# Patient Record
Sex: Male | Born: 1955 | Race: White | Hispanic: No | Marital: Married | Smoking: Former smoker
Health system: Southern US, Community
[De-identification: ages and names within clinical notes are randomized; demographics above are authoritative.]

## PROBLEM LIST (undated history)

## (undated) DIAGNOSIS — H5440 Blindness, one eye, unspecified eye: Secondary | ICD-10-CM

## (undated) DIAGNOSIS — J9383 Other pneumothorax: Secondary | ICD-10-CM

## (undated) DIAGNOSIS — E785 Hyperlipidemia, unspecified: Secondary | ICD-10-CM

## (undated) DIAGNOSIS — J449 Chronic obstructive pulmonary disease, unspecified: Secondary | ICD-10-CM

## (undated) DIAGNOSIS — I10 Essential (primary) hypertension: Secondary | ICD-10-CM

## (undated) HISTORY — PX: EYE SURGERY: SHX253

## (undated) HISTORY — PX: PLEURAL SCARIFICATION: SHX748

## (undated) HISTORY — PX: BASAL CELL CARCINOMA EXCISION: SHX1214

---

## 2008-02-15 ENCOUNTER — Ambulatory Visit: Payer: Self-pay | Admitting: Gastroenterology

## 2008-02-29 ENCOUNTER — Ambulatory Visit: Payer: Self-pay | Admitting: Gastroenterology

## 2008-03-01 ENCOUNTER — Ambulatory Visit: Payer: Self-pay | Admitting: Gastroenterology

## 2010-02-05 DIAGNOSIS — Z85828 Personal history of other malignant neoplasm of skin: Secondary | ICD-10-CM

## 2010-02-05 HISTORY — DX: Personal history of other malignant neoplasm of skin: Z85.828

## 2011-06-13 ENCOUNTER — Ambulatory Visit: Payer: Self-pay | Admitting: Family Medicine

## 2011-09-16 ENCOUNTER — Ambulatory Visit: Payer: Self-pay | Admitting: Gastroenterology

## 2011-09-18 LAB — PATHOLOGY REPORT

## 2011-11-25 ENCOUNTER — Ambulatory Visit: Payer: Self-pay | Admitting: Gastroenterology

## 2011-11-27 ENCOUNTER — Emergency Department: Payer: Self-pay | Admitting: Emergency Medicine

## 2012-03-27 ENCOUNTER — Emergency Department: Payer: Self-pay | Admitting: Emergency Medicine

## 2012-05-18 ENCOUNTER — Ambulatory Visit: Payer: Self-pay | Admitting: Gastroenterology

## 2012-05-19 LAB — PATHOLOGY REPORT

## 2012-12-14 ENCOUNTER — Ambulatory Visit: Payer: Self-pay | Admitting: Gastroenterology

## 2013-02-28 ENCOUNTER — Other Ambulatory Visit: Payer: Self-pay | Admitting: Gastroenterology

## 2013-02-28 LAB — CLOSTRIDIUM DIFFICILE(ARMC)

## 2014-01-30 ENCOUNTER — Ambulatory Visit: Payer: Self-pay | Admitting: Gastroenterology

## 2014-06-12 LAB — SURGICAL PATHOLOGY

## 2016-11-09 ENCOUNTER — Encounter: Payer: Self-pay | Admitting: Emergency Medicine

## 2016-11-09 ENCOUNTER — Emergency Department
Admission: EM | Admit: 2016-11-09 | Discharge: 2016-11-09 | Disposition: A | Discharge status: Home / Self Care | Payer: BLUE CROSS/BLUE SHIELD | Attending: Emergency Medicine | Admitting: Emergency Medicine

## 2016-11-09 DIAGNOSIS — Y999 Unspecified external cause status: Secondary | ICD-10-CM | POA: Diagnosis not present

## 2016-11-09 DIAGNOSIS — Y9301 Activity, walking, marching and hiking: Secondary | ICD-10-CM | POA: Insufficient documentation

## 2016-11-09 DIAGNOSIS — Z87891 Personal history of nicotine dependence: Secondary | ICD-10-CM | POA: Insufficient documentation

## 2016-11-09 DIAGNOSIS — R42 Dizziness and giddiness: Secondary | ICD-10-CM | POA: Diagnosis not present

## 2016-11-09 DIAGNOSIS — Z23 Encounter for immunization: Secondary | ICD-10-CM | POA: Insufficient documentation

## 2016-11-09 DIAGNOSIS — S0121XA Laceration without foreign body of nose, initial encounter: Secondary | ICD-10-CM | POA: Diagnosis not present

## 2016-11-09 DIAGNOSIS — Y929 Unspecified place or not applicable: Secondary | ICD-10-CM | POA: Insufficient documentation

## 2016-11-09 DIAGNOSIS — S022XXB Fracture of nasal bones, initial encounter for open fracture: Secondary | ICD-10-CM

## 2016-11-09 DIAGNOSIS — S0992XA Unspecified injury of nose, initial encounter: Secondary | ICD-10-CM | POA: Diagnosis present

## 2016-11-09 DIAGNOSIS — W010XXA Fall on same level from slipping, tripping and stumbling without subsequent striking against object, initial encounter: Secondary | ICD-10-CM | POA: Insufficient documentation

## 2016-11-09 HISTORY — DX: Other pneumothorax: J93.83

## 2016-11-09 LAB — BASIC METABOLIC PANEL
ANION GAP: 11 (ref 5–15)
BUN: 19 mg/dL (ref 6–20)
CALCIUM: 9.1 mg/dL (ref 8.9–10.3)
CO2: 23 mmol/L (ref 22–32)
CREATININE: 0.79 mg/dL (ref 0.61–1.24)
Chloride: 98 mmol/L — ABNORMAL LOW (ref 101–111)
Glucose, Bld: 108 mg/dL — ABNORMAL HIGH (ref 65–99)
Potassium: 4 mmol/L (ref 3.5–5.1)
Sodium: 132 mmol/L — ABNORMAL LOW (ref 135–145)

## 2016-11-09 LAB — CBC
HCT: 40.1 % (ref 40.0–52.0)
HEMOGLOBIN: 13.6 g/dL (ref 13.0–18.0)
MCH: 31.1 pg (ref 26.0–34.0)
MCHC: 34 g/dL (ref 32.0–36.0)
MCV: 91.3 fL (ref 80.0–100.0)
PLATELETS: 271 10*3/uL (ref 150–440)
RBC: 4.39 MIL/uL — AB (ref 4.40–5.90)
RDW: 13.6 % (ref 11.5–14.5)
WBC: 8.7 10*3/uL (ref 3.8–10.6)

## 2016-11-09 LAB — URINALYSIS, COMPLETE (UACMP) WITH MICROSCOPIC
Bacteria, UA: NONE SEEN
Bilirubin Urine: NEGATIVE
GLUCOSE, UA: NEGATIVE mg/dL
Hgb urine dipstick: NEGATIVE
Ketones, ur: NEGATIVE mg/dL
Leukocytes, UA: NEGATIVE
NITRITE: NEGATIVE
PH: 5 (ref 5.0–8.0)
Protein, ur: NEGATIVE mg/dL
SPECIFIC GRAVITY, URINE: 1.017 (ref 1.005–1.030)

## 2016-11-09 LAB — TROPONIN I: Troponin I: <0.03 ng/mL (ref <0.03)

## 2016-11-09 MED ORDER — LIDOCAINE HCL (PF) 1 % IJ SOLN: 30.0000 mL | Freq: Once | INTRAMUSCULAR | Status: DC

## 2016-11-09 MED ORDER — TETANUS-DIPHTH-ACELL PERTUSSIS 5-2.5-18.5 LF-MCG/0.5 IM SUSP
0.5000 mL | Freq: Once | INTRAMUSCULAR | Status: AC
  Administered 2016-11-09: 0.5 mL via INTRAMUSCULAR
  Filled 2016-11-09: qty 0.5

## 2016-11-09 MED ORDER — CEPHALEXIN 500 MG PO CAPS
500.0000 mg | ORAL_CAPSULE | Freq: Four times a day (QID) | ORAL | 0 refills | Status: AC
Start: 2016-11-09 — End: 2016-11-16

## 2016-11-09 MED ORDER — CEPHALEXIN 500 MG PO CAPS
500.0000 mg | ORAL_CAPSULE | Freq: Once | ORAL | Status: AC
  Administered 2016-11-09: 500 mg via ORAL
  Filled 2016-11-09: qty 1

## 2016-11-09 MED ORDER — LIDOCAINE HCL (PF) 1 % IJ SOLN
5.0000 mL | Freq: Once | INTRAMUSCULAR | Status: AC
  Administered 2016-11-09: 5 mL via INTRADERMAL

## 2016-11-09 MED ORDER — BACITRACIN ZINC 500 UNIT/GM EX OINT
TOPICAL_OINTMENT | Freq: Once | CUTANEOUS | Status: AC
  Administered 2016-11-09: 1 via TOPICAL
  Filled 2016-11-09: qty 0.9

## 2016-11-09 MED ORDER — LIDOCAINE HCL (PF) 1 % IJ SOLN
INTRAMUSCULAR | Status: AC
  Filled 2016-11-09: qty 5

## 2016-11-09 NOTE — ED Provider Notes (Signed)
[facility] Emergency Department Provider Note  ____________________________________________   First MD Initiated Contact with Patient 11/09/16 1821     (approximate)  I have reviewed the triage vital signs and the nursing notes.   HISTORY  Chief Complaint Dizziness and Laceration    HPI Austin Kim is a 61 y.o. male who self presents the emergency department after a near syncopal event. He has been working out recently and today he was walking briskly and then sprinting and walking briskly and sprinting. After one of these events he began to feel lightheaded off balance nauseated and weak. He fell forward striking his face. He did not lose consciousness. He does not know when his last tetanus shot was. He has no headache. He has no double vision or blurred vision. He had no antecedent chest pain or palpitations.   Past Medical History:  Diagnosis Date  . Spontaneous pneumothorax     There are no active problems to display for this patient.   Past Surgical History:  Procedure Laterality Date  . EYE SURGERY      Prior to Admission medications   Medication Sig Start Date End Date Taking? Authorizing Provider  cephALEXin (KEFLEX) 500 MG capsule Take 1 capsule (500 mg total) by mouth 4 (four) times daily. 11/09/16 11/16/16  Darel Hong, MD    Allergies Patient has no known allergies.  History reviewed. No pertinent family history.  Social History Social History  Substance Use Topics  . Smoking status: Former Research scientist (life sciences)  . Smokeless tobacco: Never Used  . Alcohol use Yes    Review of Systems Constitutional: No fever/chills ENT: No sore throat. Cardiovascular: Denies chest pain. Respiratory: Denies shortness of breath. Gastrointestinal: No abdominal pain.  No nausea, no vomiting.  No diarrhea.  No constipation. Musculoskeletal: Negative for back pain. Neurological: Negative for  headaches   ____________________________________________   PHYSICAL EXAM:  VITAL SIGNS: ED Triage Vitals  Enc Vitals Group     BP 11/09/16 1801 (!) 167/67     Pulse Rate 11/09/16 1801 96     Resp 11/09/16 1801 16     Temp 11/09/16 1800 98.2 F (36.8 C)     Temp Source 11/09/16 1800 Oral     SpO2 11/09/16 1801 100 %     Weight 11/09/16 1800 150 lb (68 kg)     Height 11/09/16 1800 5\' 9"  (1.753 m)     Head Circumference --      Peak Flow --      Pain Score 11/09/16 1800 4     Pain Loc --      Pain Edu? --      Excl. in [place]? --     Constitutional: Alert and oriented 4 pleasant cooperative speaks in full clear sentences with diaphoresis Head: Atraumatic. Nose: No septal hematomas appreciated 2 cm laceration to the bridge of his nose with underlying bone exposed Obvious fracture with moving the bridge of his nose Mouth/Throat: No trismus Neck: No stridor.   Cardiovascular: Regular rate and rhythm no murmurs Respiratory: Normal respiratory effort.  No retractions. Neurologic:  Normal speech and language. No gross focal neurologic deficits are appreciated.  Skin:  2 cm laceration as above with abrasions to bilateral kneecaps as well as abrasion to the bridge of the nose is well    ____________________________________________  LABS (all labs ordered are listed, but only abnormal results are displayed)  Labs Reviewed  BASIC METABOLIC PANEL - Abnormal; Notable for the following:  Result Value   Sodium 132 (*)    Chloride 98 (*)    Glucose, Bld 108 (*)    All other components within normal limits  CBC - Abnormal; Notable for the following:    RBC 4.39 (*)    All other components within normal limits  URINALYSIS, COMPLETE (UACMP) WITH MICROSCOPIC - Abnormal; Notable for the following:    Color, Urine YELLOW (*)    APPearance CLEAR (*)    Squamous Epithelial / LPF 0-5 (*)    All other components within normal limits  TROPONIN I    Blood work reviewed  interpreted by me as normal __________________________________________  EKG  ED ECG REPORT I, Darel Hong, the attending physician, personally viewed and interpreted this ECG.  Date: 11/09/2016 EKG Time:  Rate: 89 Rhythm: normal sinus rhythm QRS Axis: normal Intervals: normal ST/T Wave abnormalities: normal Narrative Interpretation: no evidence of acute ischemia  ____________________________________________  RADIOLOGY  ____________________________________________   PROCEDURES  Procedure(s) performed: yes  LACERATION REPAIR Performed by: Darel Hong Authorized by: Darel Hong Consent: Verbal consent obtained. Risks and benefits: risks, benefits and alternatives were discussed Consent given by: patient Patient identity confirmed: provided demographic data Prepped and Draped in normal sterile fashion Wound explored  Laceration Location: bridge of nose  Laceration Length: 2cm  No Foreign Bodies seen or palpated  Anesthesia: local infiltration  Local anesthetic: lidocaine 1% without epinephrine  Anesthetic total: 3 ml  Irrigation method: syringe Amount of cleaning: standard  Skin closure: simple interrupted  Number of sutures: 3  Technique: 6-0 simple interrupted  Patient tolerance: Patient tolerated the procedure well with no immediate complications.   Procedures  Critical Care performed: no  Observation: no ____________________________________________   INITIAL IMPRESSION / ASSESSMENT AND PLAN / ED COURSE  Pertinent labs & imaging results that were available during my care of the patient were reviewed by me and considered in my medical decision making (see chart for details).   The patient's syncopal event is clearly vasovagal and not cardiogenic. His EKG has no arrhythmias, no blocks, normal intervals, no signs of [place] no signs of HOCM. He has no family history of sudden cardiac death. I was able to anesthetize his nasal wound and  explored it with good overhead lighting in a bloodless field and identify a nasal fracture underneath. After copious irrigation with normal saline and was able to provide adequate coverage with good cosmesis. As this is an open fracture will cover him with Keflex and refer him to otolaryngology in 5 days for evaluation and treatment of his nasal fracture. He is discharged home in improved condition.      ____________________________________________   FINAL CLINICAL IMPRESSION(S) / ED DIAGNOSES  Final diagnoses:  Open fracture of nasal bone, initial encounter  Laceration of nose, initial encounter      NEW MEDICATIONS STARTED DURING THIS VISIT:  Discharge Medication List as of 11/09/2016  6:57 PM    START taking these medications   Details  cephALEXin (KEFLEX) 500 MG capsule Take 1 capsule (500 mg total) by mouth 4 (four) times daily., Starting Sun 11/09/2016, Until Sun 11/16/2016, Print         Note:  This document was prepared using Dragon voice recognition software and may include unintentional dictation errors.      Darel Hong, MD 11/10/16 1431

## 2016-11-09 NOTE — ED Triage Notes (Addendum)
Pt was walking and then sprinted for 20 seconds when he became dizzy and almost passed out.  This has never happened before per pt.  Small wound noted to nose.  Did not pass all the way out. No medical history.  Ambulatory into ED without difficulty. VSS.  Describes as light headed.

## 2016-11-09 NOTE — Discharge Instructions (Signed)
Please take all of your antibiotics as prescribed and follow up with the ENT surgeon this coming Friday for a recheck.  Today I put 3 6-0 prolene sutures which will need to come out.  It's a good idea to keep your wound clean with soap and water, but please don't wash it until tomorrow.  Return to the ED for any concerns.  It was a pleasure to take care of you today, and thank you for coming to our emergency department.  If you have any questions or concerns before leaving please ask the nurse to grab me and I'm more than happy to go through your aftercare instructions again.  If you were prescribed any opioid pain medication today such as Norco, Vicodin, Percocet, morphine, hydrocodone, or oxycodone please make sure you do not drive when you are taking this medication as it can alter your ability to drive safely.  If you have any concerns once you are home that you are not improving or are in fact getting worse before you can make it to your follow-up appointment, please do not hesitate to call 911 and come back for further evaluation.  Darel Hong, MD  Results for orders placed or performed during the hospital encounter of 97/58/83  Basic metabolic panel  Result Value Ref Range   Sodium 132 (L) 135 - 145 mmol/L   Potassium 4.0 3.5 - 5.1 mmol/L   Chloride 98 (L) 101 - 111 mmol/L   CO2 23 22 - 32 mmol/L   Glucose, Bld 108 (H) 65 - 99 mg/dL   BUN 19 6 - 20 mg/dL   Creatinine, Ser 0.79 0.61 - 1.24 mg/dL   Calcium 9.1 8.9 - 10.3 mg/dL   GFR calc non Af Amer >60 >60 mL/min   GFR calc Af Amer >60 >60 mL/min   Anion gap 11 5 - 15  CBC  Result Value Ref Range   WBC 8.7 3.8 - 10.6 K/uL   RBC 4.39 (L) 4.40 - 5.90 MIL/uL   Hemoglobin 13.6 13.0 - 18.0 g/dL   HCT 40.1 40.0 - 52.0 %   MCV 91.3 80.0 - 100.0 fL   MCH 31.1 26.0 - 34.0 pg   MCHC 34.0 32.0 - 36.0 g/dL   RDW 13.6 11.5 - 14.5 %   Platelets 271 150 - 440 K/uL  Urinalysis, Complete w Microscopic  Result Value Ref Range   Color,  Urine YELLOW (A) YELLOW   APPearance CLEAR (A) CLEAR   Specific Gravity, Urine 1.017 1.005 - 1.030   pH 5.0 5.0 - 8.0   Glucose, UA NEGATIVE NEGATIVE mg/dL   Hgb urine dipstick NEGATIVE NEGATIVE   Bilirubin Urine NEGATIVE NEGATIVE   Ketones, ur NEGATIVE NEGATIVE mg/dL   Protein, ur NEGATIVE NEGATIVE mg/dL   Nitrite NEGATIVE NEGATIVE   Leukocytes, UA NEGATIVE NEGATIVE   RBC / HPF 0-5 0 - 5 RBC/hpf   WBC, UA 0-5 0 - 5 WBC/hpf   Bacteria, UA NONE SEEN NONE SEEN   Squamous Epithelial / LPF 0-5 (A) NONE SEEN   Mucus PRESENT    Hyaline Casts, UA PRESENT   Troponin I  Result Value Ref Range   Troponin I <0.03 <0.03 ng/mL   No results found.

## 2017-07-27 ENCOUNTER — Encounter: Payer: Self-pay | Admitting: *Deleted

## 2017-07-28 ENCOUNTER — Ambulatory Visit: Payer: BLUE CROSS/BLUE SHIELD | Admitting: Anesthesiology

## 2017-07-28 ENCOUNTER — Encounter: Payer: Self-pay | Admitting: *Deleted

## 2017-07-28 ENCOUNTER — Encounter
Admission: RE | Disposition: A | Discharge status: Home / Self Care | Payer: Self-pay | Source: Ambulatory Visit | Attending: Gastroenterology

## 2017-07-28 ENCOUNTER — Ambulatory Visit
Admission: RE | Admit: 2017-07-28 | Discharge: 2017-07-28 | Disposition: A | Discharge status: Home / Self Care | Payer: BLUE CROSS/BLUE SHIELD | Source: Ambulatory Visit | Attending: Gastroenterology | Admitting: Gastroenterology

## 2017-07-28 DIAGNOSIS — Z86010 Personal history of colonic polyps: Secondary | ICD-10-CM | POA: Insufficient documentation

## 2017-07-28 DIAGNOSIS — Z85828 Personal history of other malignant neoplasm of skin: Secondary | ICD-10-CM | POA: Diagnosis not present

## 2017-07-28 DIAGNOSIS — K621 Rectal polyp: Secondary | ICD-10-CM | POA: Insufficient documentation

## 2017-07-28 DIAGNOSIS — K635 Polyp of colon: Secondary | ICD-10-CM | POA: Insufficient documentation

## 2017-07-28 DIAGNOSIS — D128 Benign neoplasm of rectum: Secondary | ICD-10-CM | POA: Insufficient documentation

## 2017-07-28 DIAGNOSIS — Z79899 Other long term (current) drug therapy: Secondary | ICD-10-CM | POA: Diagnosis not present

## 2017-07-28 DIAGNOSIS — I10 Essential (primary) hypertension: Secondary | ICD-10-CM | POA: Insufficient documentation

## 2017-07-28 DIAGNOSIS — H5461 Unqualified visual loss, right eye, normal vision left eye: Secondary | ICD-10-CM | POA: Diagnosis not present

## 2017-07-28 DIAGNOSIS — Z1211 Encounter for screening for malignant neoplasm of colon: Secondary | ICD-10-CM | POA: Insufficient documentation

## 2017-07-28 DIAGNOSIS — J449 Chronic obstructive pulmonary disease, unspecified: Secondary | ICD-10-CM | POA: Insufficient documentation

## 2017-07-28 DIAGNOSIS — Z87891 Personal history of nicotine dependence: Secondary | ICD-10-CM | POA: Insufficient documentation

## 2017-07-28 DIAGNOSIS — Z800 Family history of malignant neoplasm of digestive organs: Secondary | ICD-10-CM | POA: Diagnosis not present

## 2017-07-28 DIAGNOSIS — K5730 Diverticulosis of large intestine without perforation or abscess without bleeding: Secondary | ICD-10-CM | POA: Insufficient documentation

## 2017-07-28 HISTORY — PX: COLONOSCOPY WITH PROPOFOL: SHX5780

## 2017-07-28 HISTORY — DX: Blindness, one eye, unspecified eye: H54.40

## 2017-07-28 HISTORY — DX: Hyperlipidemia, unspecified: E78.5

## 2017-07-28 HISTORY — DX: Chronic obstructive pulmonary disease, unspecified: J44.9

## 2017-07-28 HISTORY — DX: Essential (primary) hypertension: I10

## 2017-07-28 SURGERY — COLONOSCOPY WITH PROPOFOL
Anesthesia: General

## 2017-07-28 MED ORDER — SODIUM CHLORIDE 0.9 % IV SOLN
INTRAVENOUS | Status: DC
  Administered 2017-07-28: 1000 mL via INTRAVENOUS
  Administered 2017-07-28: 13:00:00 via INTRAVENOUS

## 2017-07-28 MED ORDER — FENTANYL CITRATE (PF) 100 MCG/2ML IJ SOLN
INTRAMUSCULAR | Status: DC | PRN
  Administered 2017-07-28: 50 ug via INTRAVENOUS

## 2017-07-28 MED ORDER — PROPOFOL 10 MG/ML IV BOLUS
INTRAVENOUS | Status: DC | PRN
  Administered 2017-07-28: 80 mg via INTRAVENOUS

## 2017-07-28 MED ORDER — EPHEDRINE SULFATE 50 MG/ML IJ SOLN
INTRAMUSCULAR | Status: DC | PRN
  Administered 2017-07-28 (×2): 10 mg via INTRAVENOUS

## 2017-07-28 MED ORDER — PROPOFOL 500 MG/50ML IV EMUL
INTRAVENOUS | Status: DC | PRN
  Administered 2017-07-28: 100 ug/kg/min via INTRAVENOUS

## 2017-07-28 MED ORDER — FENTANYL CITRATE (PF) 100 MCG/2ML IJ SOLN
INTRAMUSCULAR | Status: AC
  Filled 2017-07-28: qty 2

## 2017-07-28 MED ORDER — PROPOFOL 500 MG/50ML IV EMUL
INTRAVENOUS | Status: AC
  Filled 2017-07-28: qty 50

## 2017-07-28 MED ORDER — MIDAZOLAM HCL 2 MG/2ML IJ SOLN
INTRAMUSCULAR | Status: AC
  Filled 2017-07-28: qty 2

## 2017-07-28 NOTE — Transfer of Care (Signed)
Immediate Anesthesia Transfer of Care Note  Patient: Austin Kim  Procedure(s) Performed: COLONOSCOPY WITH PROPOFOL (N/A )  Patient Location: Endoscopy Unit  Anesthesia Type:General  Level of Consciousness: awake, alert  and oriented  Airway & Oxygen Therapy: Patient Spontanous Breathing and Patient connected to nasal cannula oxygen  Post-op Assessment: Report given to RN and Post -op Vital signs reviewed and stable  Post vital signs: Reviewed and stable  Last Vitals:  Vitals Value Taken Time  BP 132/80 07/28/2017  2:01 PM  Temp    Pulse 89 07/28/2017  2:01 PM  Resp 15 07/28/2017  2:01 PM  SpO2 100 % 07/28/2017  2:01 PM  Vitals shown include unvalidated device data.  Last Pain:  Vitals:   07/28/17 1152  TempSrc: Tympanic  PainSc: 0-No pain         Complications: No apparent anesthesia complications

## 2017-07-28 NOTE — Anesthesia Preprocedure Evaluation (Signed)
Anesthesia Evaluation  Patient identified by MRN, date of birth, ID band Patient awake    Reviewed: Allergy & Precautions, H&P , NPO status , Patient's Chart, lab work & pertinent test results, reviewed documented beta blocker date and time   Airway Mallampati: II   Neck ROM: full    Dental  (+) Teeth Intact   Pulmonary neg pulmonary ROS, COPD, former smoker,    Pulmonary exam normal        Cardiovascular Exercise Tolerance: Good hypertension, On Medications negative cardio ROS Normal cardiovascular exam Rhythm:regular Rate:Normal     Neuro/Psych negative neurological ROS  negative psych ROS   GI/Hepatic negative GI ROS, Neg liver ROS,   Endo/Other  negative endocrine ROS  Renal/GU negative Renal ROS  negative genitourinary   Musculoskeletal   Abdominal   Peds  Hematology negative hematology ROS (+)   Anesthesia Other Findings Past Medical History: No date: Blindness of right eye No date: COPD (chronic obstructive pulmonary disease) (HCC) No date: Elevated lipids No date: Hypertension No date: Spontaneous pneumothorax Past Surgical History: No date: BASAL CELL CARCINOMA EXCISION; Bilateral No date: EYE SURGERY No date: PLEURAL SCARIFICATION BMI    Body Mass Index:  23.63 kg/m     Reproductive/Obstetrics negative OB ROS                             Anesthesia Physical Anesthesia Plan  ASA: III  Anesthesia Plan: General   Post-op Pain Management:    Induction:   PONV Risk Score and Plan:   Airway Management Planned:   Additional Equipment:   Intra-op Plan:   Post-operative Plan:   Informed Consent: I have reviewed the patients History and Physical, chart, labs and discussed the procedure including the risks, benefits and alternatives for the proposed anesthesia with the patient or authorized representative who has indicated his/her understanding and acceptance.    Dental Advisory Given  Plan Discussed with: CRNA  Anesthesia Plan Comments:         Anesthesia Quick Evaluation

## 2017-07-28 NOTE — Anesthesia Post-op Follow-up Note (Signed)
Anesthesia QCDR form completed.        

## 2017-07-28 NOTE — Op Note (Signed)
[facility] Gastroenterology Patient Name: Austin Kim Procedure Date: 07/28/2017 12:40 PM MRN: [id number] Account #: [id number] Date of Birth: Jul 05, 1955 Admit Type: Outpatient Age: 62 Room: [facility] ENDO ROOM 3 Gender: Male Note Status: Finalized Procedure:            Colonoscopy Indications:          Personal history of colonic polyps Providers:            Lollie Sails, MD Referring MD:         Youlanda Roys. Lovie Macadamia, MD (Referring MD) Medicines:            Monitored Anesthesia Care Complications:        No immediate complications. Procedure:            Pre-Anesthesia Assessment:                       - ASA Grade Assessment: III - A patient with severe                        systemic disease.                       After obtaining informed consent, the colonoscope was                        passed under direct vision. Throughout the procedure,                        the patient's blood pressure, pulse, and oxygen                        saturations were monitored continuously. The Olympus                        PCF-H180AL colonoscope ( S#: [id number] ) was introduced                        through the anus and advanced to the the cecum,                        identified by appendiceal orifice and ileocecal valve.                        The colonoscopy was performed without difficulty. The                        patient tolerated the procedure well. The quality of                        the bowel preparation was good. Findings:      Two sessile polyps were found in the rectum. The polyps were 2 to 3 mm       in size. These polyps were removed with a cold snare. Resection and       retrieval were complete.      A 2 mm polyp was found in the hepatic flexure. The polyp was sessile.       The polyp was removed with a cold biopsy forceps. Resection and       retrieval were complete.      -The tatoo placed on previous procedure in  the hepatic flexure was       located  and noted to have no evidence of polyp recurrence.      A 1 mm polyp was found in the distal rectum. The polyp was sessile. The       polyp was removed with a cold biopsy forceps. Resection and retrieval       were complete.      Multiple small to medium-mouthed diverticula were found in the sigmoid       colon, descending colon and distal transverse colon.      The digital rectal exam was normal.      The exam was otherwise without abnormality. Impression:           - Two 2 to 3 mm polyps in the rectum, removed with a                        cold snare. Resected and retrieved.                       - One 2 mm polyp at the hepatic flexure, removed with a                        cold biopsy forceps. Resected and retrieved.                       - One 1 mm polyp in the rectum, removed with a cold                        biopsy forceps. Resected and retrieved.                       - Diverticulosis in the sigmoid colon, in the                        descending colon and in the distal transverse colon.                       - The examination was otherwise normal. Recommendation:       - Soft diet for 2 days, then advance as tolerated to                        advance diet as tolerated. Procedure Code(s):    --- Professional ---                       [phone], Colonoscopy, flexible; with removal of tumor(s),                        polyp(s), or other lesion(s) by snare technique                       [id number], 28, Colonoscopy, flexible; with biopsy, single                        or multiple Diagnosis Code(s):    --- Professional ---                       K62.1, Rectal polyp  D12.3, Benign neoplasm of transverse colon (hepatic                        flexure or splenic flexure)                       Z86.010, Personal history of colonic polyps                       K57.30, Diverticulosis of large intestine without                        perforation or abscess without bleeding CPT  copyright 2017 American Medical Association. All rights reserved. The codes documented in this report are preliminary and upon coder review may  be revised to meet current compliance requirements. Lollie Sails, MD 07/28/2017 2:02:34 PM This report has been signed electronically. Number of Addenda: 0 Note Initiated On: 07/28/2017 12:40 PM Scope Withdrawal Time: 0 hours 10 minutes 26 seconds  Total Procedure Duration: 0 hours 29 minutes 14 seconds       [facility]

## 2017-07-28 NOTE — H&P (Signed)
Outpatient short stay form Pre-procedure 07/28/2017 1:17 PM Austin Sails MD  Primary Physician: Dr. Juluis Pitch  Reason for visit: Colonoscopy  History of present illness: Patient is a 62 year old male presenting today as above.  He has personal history of adenomatous colon polyps and family history of colon cancer in a primary relative.  Tolerated his prep well.  He takes no aspirin or blood thinning agent.    Current Facility-Administered Medications:  .  0.9 %  sodium chloride infusion, , Intravenous, Continuous, Austin Sails, MD, Last Rate: 20 mL/hr at 07/28/17 1207  Medications Prior to Admission  Medication Sig Dispense Refill Last Dose  . Melatonin 1 MG TABS Take by mouth.   Past Week at Unknown time     No Known Allergies   Past Medical History:  Diagnosis Date  . Blindness of right eye   . COPD (chronic obstructive pulmonary disease) ([place])   . Elevated lipids   . Hypertension   . Spontaneous pneumothorax     Review of systems:      Physical Exam    Heart and lungs: Good rate and rhythm without rub or gallop, lungs are bilaterally clear.    HEENT: Normal cephalic atraumatic eyes are anicteric    Other:    Pertinant exam for procedure: Soft nontender nondistended bowel sounds positive normoactive.    Planned proceedures: Colonoscopy and indicated procedures. I have discussed the risks benefits and complications of procedures to include not limited to bleeding, infection, perforation and the risk of sedation and the patient wishes to proceed.    Austin Sails, MD Gastroenterology 07/28/2017  1:17 PM

## 2017-07-29 ENCOUNTER — Encounter: Payer: Self-pay | Admitting: Gastroenterology

## 2017-07-29 NOTE — Anesthesia Postprocedure Evaluation (Signed)
Anesthesia Post Note  Patient: AQUILLA VOILES III  Procedure(s) Performed: COLONOSCOPY WITH PROPOFOL (N/A )  Patient location during evaluation: Endoscopy Anesthesia Type: General Level of consciousness: awake and alert Pain management: pain level controlled Vital Signs Assessment: post-procedure vital signs reviewed and stable Respiratory status: spontaneous breathing, nonlabored ventilation and respiratory function stable Cardiovascular status: blood pressure returned to baseline and stable Postop Assessment: no apparent nausea or vomiting Anesthetic complications: no     Last Vitals:  Vitals:   07/28/17 1411 07/28/17 1421  BP: (!) 146/75 135/80  Pulse: 82 77  Resp: 17 13  Temp:    SpO2: 100% 100%    Last Pain:  Vitals:   07/29/17 0747  TempSrc:   PainSc: 0-No pain                 Alphonsus Sias

## 2017-07-30 LAB — SURGICAL PATHOLOGY

## 2017-12-03 ENCOUNTER — Ambulatory Visit: Payer: Self-pay | Admitting: Adult Health

## 2017-12-03 NOTE — Progress Notes (Unsigned)
Office visit  documented on paper chart per protocol initiated by Theodis Sato RN occupational  supervisor and in agreement with collaborating physician Miguel Aschoff MD.   See [facility] Staff and Wellness and or [place] workers compensation department for copy.   See paper chart at [facility].

## 2018-01-18 ENCOUNTER — Emergency Department: Payer: BLUE CROSS/BLUE SHIELD

## 2018-01-18 ENCOUNTER — Emergency Department
Admission: EM | Admit: 2018-01-18 | Discharge: 2018-01-18 | Disposition: A | Discharge status: Home / Self Care | Payer: BLUE CROSS/BLUE SHIELD | Attending: Emergency Medicine | Admitting: Emergency Medicine

## 2018-01-18 ENCOUNTER — Encounter: Payer: Self-pay | Admitting: Medical Oncology

## 2018-01-18 DIAGNOSIS — R079 Chest pain, unspecified: Secondary | ICD-10-CM | POA: Diagnosis present

## 2018-01-18 DIAGNOSIS — R0789 Other chest pain: Secondary | ICD-10-CM | POA: Insufficient documentation

## 2018-01-18 DIAGNOSIS — I10 Essential (primary) hypertension: Secondary | ICD-10-CM | POA: Insufficient documentation

## 2018-01-18 DIAGNOSIS — R634 Abnormal weight loss: Secondary | ICD-10-CM | POA: Insufficient documentation

## 2018-01-18 DIAGNOSIS — Z87891 Personal history of nicotine dependence: Secondary | ICD-10-CM | POA: Diagnosis not present

## 2018-01-18 DIAGNOSIS — R531 Weakness: Secondary | ICD-10-CM | POA: Diagnosis not present

## 2018-01-18 DIAGNOSIS — J449 Chronic obstructive pulmonary disease, unspecified: Secondary | ICD-10-CM | POA: Insufficient documentation

## 2018-01-18 LAB — CBC
HCT: 44.4 % (ref 39.0–52.0)
Hemoglobin: 15 g/dL (ref 13.0–17.0)
MCH: 31.5 pg (ref 26.0–34.0)
MCHC: 33.8 g/dL (ref 30.0–36.0)
MCV: 93.3 fL (ref 80.0–100.0)
Platelets: 298 10*3/uL (ref 150–400)
RBC: 4.76 MIL/uL (ref 4.22–5.81)
RDW: 12.5 % (ref 11.5–15.5)
WBC: 10.2 10*3/uL (ref 4.0–10.5)
nRBC: 0 % (ref 0.0–0.2)

## 2018-01-18 LAB — URINALYSIS, COMPLETE (UACMP) WITH MICROSCOPIC
Bacteria, UA: NONE SEEN
Bilirubin Urine: NEGATIVE
Glucose, UA: NEGATIVE mg/dL
Hgb urine dipstick: NEGATIVE
Ketones, ur: 20 mg/dL — AB
Leukocytes, UA: NEGATIVE
Nitrite: NEGATIVE
Protein, ur: NEGATIVE mg/dL
Specific Gravity, Urine: 1.015 (ref 1.005–1.030)
pH: 7 (ref 5.0–8.0)

## 2018-01-18 LAB — TROPONIN I

## 2018-01-18 LAB — BASIC METABOLIC PANEL
Anion gap: 11 (ref 5–15)
BUN: 15 mg/dL (ref 6–20)
CALCIUM: 11 mg/dL — AB (ref 8.9–10.3)
CO2: 27 mmol/L (ref 22–32)
Chloride: 97 mmol/L — ABNORMAL LOW (ref 98–111)
Creatinine, Ser: 0.67 mg/dL (ref 0.61–1.24)
GFR calc Af Amer: >60 mL/min (ref >60)
Glucose, Bld: 124 mg/dL — ABNORMAL HIGH (ref 70–99)
Potassium: 4.2 mmol/L (ref 3.5–5.1)
SODIUM: 135 mmol/L (ref 135–145)

## 2018-01-18 MED ORDER — SODIUM CHLORIDE 0.9 % IV SOLN
Freq: Once | INTRAVENOUS | Status: AC
  Administered 2018-01-18: 13:00:00 via INTRAVENOUS

## 2018-01-18 MED ORDER — IOPAMIDOL (ISOVUE-300) INJECTION 61%
100.0000 mL | Freq: Once | INTRAVENOUS | Status: AC | PRN
  Administered 2018-01-18: 100 mL via INTRAVENOUS

## 2018-01-18 NOTE — ED Notes (Signed)
Patient transported to CT 

## 2018-01-18 NOTE — ED Triage Notes (Signed)
Pt reports that he has been having intermittent central chest pain since Friday and feeling weak. Pt ambulatory and in NAD at this time, denies pain currently.

## 2018-01-18 NOTE — ED Notes (Signed)
Pt taken to Xray at this time.

## 2018-01-18 NOTE — ED Provider Notes (Signed)
[facility] Emergency Department Provider Note       Time seen: ----------------------------------------- 11:56 AM on 01/18/2018 -----------------------------------------   I have reviewed the triage vital signs and the nursing notes.  HISTORY   Chief Complaint Chest Pain and Weakness    HPI Austin Kim is a 62 y.o. male with a history of COPD, hypertension, spontaneous pneumothorax who presents to the ED for intermittent central chest pain since Friday and feeling weak.  He denies any current pain.  Patient had had some pain which has resolved now.  He denies fevers, chills, vomiting or diarrhea.  Past Medical History:  Diagnosis Date  . Blindness of right eye   . COPD (chronic obstructive pulmonary disease) ([place])   . Elevated lipids   . Hypertension   . Spontaneous pneumothorax     There are no active problems to display for this patient.   Past Surgical History:  Procedure Laterality Date  . BASAL CELL CARCINOMA EXCISION Bilateral   . COLONOSCOPY WITH PROPOFOL N/A 07/28/2017   Procedure: COLONOSCOPY WITH PROPOFOL;  Surgeon: Lollie Sails, MD;  Location: [facility] ENDOSCOPY;  Service: Endoscopy;  Laterality: N/A;  . EYE SURGERY    . PLEURAL SCARIFICATION      Allergies Patient has no known allergies.  Social History Social History   Tobacco Use  . Smoking status: Former Smoker    Last attempt to quit: 2001    Years since quitting: 18.9  . Smokeless tobacco: Never Used  Substance Use Topics  . Alcohol use: Yes  . Drug use: No    Review of Systems Constitutional: Negative for fever. Cardiovascular: Positive for recent chest pain Respiratory: Negative for shortness of breath. Gastrointestinal: Negative for abdominal pain, vomiting and diarrhea. Musculoskeletal: Negative for back pain. Skin: Negative for rash. Neurological: Positive for generalized weakness  All systems negative/normal/unremarkable except as stated in the  HPI  ____________________________________________   PHYSICAL EXAM:  VITAL SIGNS: ED Triage Vitals [01/18/18 1125]  Enc Vitals Group     BP (!) 167/98     Pulse Rate 90     Resp 16     Temp 98.6 F (37 C)     Temp Source Oral     SpO2 96 %     Weight 158 lb (71.7 kg)     Height 5\' 9"  (1.753 m)     Head Circumference      Peak Flow      Pain Score 0     Pain Loc      Pain Edu?      Excl. in [place]?    Constitutional: Alert and oriented. Well appearing and in no distress. Eyes: Conjunctivae are normal. Normal extraocular movements. ENT   Head: Normocephalic and atraumatic.   Nose: No congestion/rhinnorhea.   Mouth/Throat: Mucous membranes are moist.   Neck: No stridor. Cardiovascular: Normal rate, regular rhythm. No murmurs, rubs, or gallops. Respiratory: Normal respiratory effort without tachypnea nor retractions. Breath sounds are clear and equal bilaterally. No wheezes/rales/rhonchi. Gastrointestinal: Soft and nontender. Normal bowel sounds Musculoskeletal: Nontender with normal range of motion in extremities. No lower extremity tenderness nor edema. Neurologic:  Normal speech and language. No gross focal neurologic deficits are appreciated.  Skin:  Skin is warm, dry and intact. No rash noted. Psychiatric: Mood and affect are normal. Speech and behavior are normal.  ____________________________________________  EKG: Interpreted by me.  Sinus rhythm rate 81 bpm, normal PR interval, normal QRS, nonspecific ST changes  ____________________________________________  ED COURSE:  As part of my medical decision making, I reviewed the following data within the [place] History obtained from family if available, nursing notes, old chart and ekg, as well as notes from prior ED visits. Patient presented for intermittent chest pain, we will assess with labs and imaging as indicated at this time.    Procedures ____________________________________________   LABS (pertinent positives/negatives)  Labs Reviewed  BASIC METABOLIC PANEL - Abnormal; Notable for the following components:      Result Value   Chloride 97 (*)    Glucose, Bld 124 (*)    Calcium 11.0 (*)    All other components within normal limits  URINALYSIS, COMPLETE (UACMP) WITH MICROSCOPIC - Abnormal; Notable for the following components:   Color, Urine AMBER (*)    APPearance HAZY (*)    Ketones, ur 20 (*)    All other components within normal limits  CBC  TROPONIN I    RADIOLOGY Images were viewed by me  Chest x-ray IMPRESSION: 1. No active cardiopulmonary disease. No evidence of pneumonia or pulmonary edema. 2. Chronic interstitial lung disease/fibrosis. IMPRESSION: Chest:  1. Left lower lobe subpleural nodularity, with a dominant nodule measuring 2.2 cm. This is associated with linear opacity extending from the posterior pleura to the diaphragmatic pleura and may reflect atelectasis/scar. There are additional small perifissural nodules along the left major fissure measuring up to 7 mm. Follow-up chest CT in 3 months is recommended to evaluate for interval change. 2. Chronic interstitial lung disease and emphysema (ICD10-J43.9). 3.  Aortic atherosclerosis (ICD10-I70.0).  Abdomen and pelvis:  1.  No acute intra-abdominal process. 2. Mild circumferential bladder wall thickening, nonspecific. Correlate with urinalysis to exclude cystitis.  ____________________________________________  DIFFERENTIAL DIAGNOSIS   Musculoskeletal pain, GERD, anxiety, unstable angina, PE, pneumothorax  FINAL ASSESSMENT AND PLAN  Chest pain, weakness   Plan: The patient had presented for chest pain and weakness. Patient's labs were negative with the exception of hypercalcemia.  Patient's imaging did not reveal any acute process.  Patient reportedly had a chest procedure done on the left side and there is scarring  present from where that was done previously.  Essentially CT imaging was done to rule out occult cancer which we did not find.  Patient told me that recently he had his calcium medication changed which initially I was not aware that he was taking calcium.  I will have him decrease this dose to half of what he was taking and follow-up with his doctor.   Laurence Aly, MD   Note: This note was generated in part or whole with voice recognition software. Voice recognition is usually quite accurate but there are transcription errors that can and very often do occur. I apologize for any typographical errors that were not detected and corrected.     Earleen Newport, MD 01/18/18 [phone]

## 2018-12-08 ENCOUNTER — Ambulatory Visit: Payer: Self-pay

## 2018-12-08 ENCOUNTER — Other Ambulatory Visit: Payer: Self-pay

## 2018-12-08 DIAGNOSIS — Z23 Encounter for immunization: Secondary | ICD-10-CM

## 2019-04-12 DIAGNOSIS — C4492 Squamous cell carcinoma of skin, unspecified: Secondary | ICD-10-CM

## 2019-04-12 DIAGNOSIS — Z86018 Personal history of other benign neoplasm: Secondary | ICD-10-CM

## 2019-04-12 HISTORY — DX: Personal history of other benign neoplasm: Z86.018

## 2019-04-12 HISTORY — DX: Squamous cell carcinoma of skin, unspecified: C44.92

## 2019-05-09 ENCOUNTER — Ambulatory Visit (INDEPENDENT_AMBULATORY_CARE_PROVIDER_SITE_OTHER): Payer: BC Managed Care – PPO | Admitting: Dermatology

## 2019-05-09 ENCOUNTER — Other Ambulatory Visit: Payer: Self-pay

## 2019-05-09 DIAGNOSIS — D239 Other benign neoplasm of skin, unspecified: Secondary | ICD-10-CM

## 2019-05-09 DIAGNOSIS — D235 Other benign neoplasm of skin of trunk: Secondary | ICD-10-CM | POA: Diagnosis not present

## 2019-05-09 NOTE — Progress Notes (Signed)
   Follow-Up Visit   Subjective  Austin Kim is a 64 y.o. male who presents for the following: Procedure (Mod/Severe dysplastic nevus - Right spinal mid back).   The following portions of the chart were reviewed this encounter and updated as appropriate:     Review of Systems: No other skin or systemic complaints.  Objective  Well appearing patient in no apparent distress; mood and affect are within normal limits.  A focused examination was performed including back. Relevant physical exam findings are noted in the Assessment and Plan.  Objective  Right Spinal Mid Back: Healing pink biopsy site.   Assessment & Plan  Dysplastic nevus Right Spinal Mid Back  Skin excision - Right Spinal Mid Back  Lesion length (cm):  1 Lesion width (cm):  1 Margin per side (cm):  0.2 Total excision diameter (cm):  1.4 Informed consent: discussed and consent obtained   Timeout: patient name, date of birth, surgical site, and procedure verified   Procedure prep:  Patient was prepped and draped in usual sterile fashion Prep type:  Povidone-iodine Anesthesia: the lesion was anesthetized in a standard fashion   Anesthetic:  1% lidocaine w/ epinephrine 1-100,000 buffered w/ 8.4% NaHCO3 (13cc) Instrument used: #15 blade   Hemostasis achieved with: pressure and electrodesiccation   Outcome: patient tolerated procedure well with no complications   Post-procedure details: sterile dressing applied and wound care instructions given   Dressing type: petrolatum and pressure dressing    Skin repair - Right Spinal Mid Back Complexity:  Intermediate Final length (cm):  3.3 Reason for type of repair: reduce tension to allow closure   Undermining: edges could be approximated without difficulty and edges undermined   Subcutaneous layers (deep stitches):  Suture size:  3-0 Suture type: Vicryl (polyglactin 910)   Stitches:  Buried vertical mattress Fine/surface layer approximation (top stitches):    Suture size:  3-0 Suture type comment:  Nylon Stitches: simple interrupted   Suture removal (days):  7 Hemostasis achieved with: suture Outcome: patient tolerated procedure well with no complications   Post-procedure details: sterile dressing applied and wound care instructions given   Dressing type: pressure dressing (mupirocin)    Specimen 1 - Surgical pathology Differential Diagnosis: Mod/Severe Dysplastic Nevus, biopsy proven Check Margins: Yes Healing pink biopsy site 1.0cm  [id number] Tag superior medial 12:00  Return in about 1 week (around 05/16/2019) for suture removal and EDC SCCIS scalp.

## 2019-05-09 NOTE — Patient Instructions (Addendum)
Wound Care Instructions  On the day following your surgery, you should begin doing daily dressing changes: 1. Remove the old dressing and discard it. 2. Cleanse the wound gently with tap water. This may be done in the shower or by placing a wet gauze pad directly on the wound and letting it soak for several minutes. 3. It is important to gently remove any dried blood from the wound in order to encourage healing. This may be done by gently rolling a moistened Q-tip on the dried blood. Do not pick at the wound. 4. If the wound should start to bleed, continue cleaning the wound, then place a moist gauze pad on the wound and hold pressure for a few minutes.  5. Make sure you then dry the skin surrounding the wound completely or the tape will not stick to the skin. Do not use cotton balls on the wound. 6. After the wound is clean and dry, apply the ointment gently with a Q-tip. 7. Cut a non-stick pad to fit the size of the wound. Lay the pad flush to the wound. If the wound is draining, you may want to reinforce it with a small amount of gauze on top of the non-stick pad for a little added compression to the area. 8. Use the tape to seal the area completely. 9. Select from the following with respect to your individual situation: 1. If your wound has been stitched closed: continue the above steps 1-8 at least daily until your sutures are removed. 2. If your wound has been left open to heal: continue steps 1-8 at least daily for the first 3-4 weeks. 10. We would like for you to take a few extra precautions for at least the next week. 1. Sleep with your head elevated on pillows if our wound is on your head. 2. Do not bend over or lift heavy items to reduce the chance of elevated blood pressure to the wound 3. Do not participate in particularly strenuous activities.   Below is a list of dressing supplies you might need.  . Cotton-tipped applicators - Q-tips . Gauze pads (2x2 and/or 4x4) - All-Purpose  Sponges . Non-stick dressing material - Telfa . Tape - Paper or Hypafix . New and clean tube of petroleum jelly - Vaseline    Comments on Post-Operative Period 1. Slight swelling and redness often appear around the wound. This is normal and will disappear within several days following the surgery. 2. The healing wound will drain a brownish-red-yellow discharge during healing. This is a normal phase of wound healing. As the wound begins to heal, the drainage may increase in amount. Again, this drainage is normal. 3. Notify us if the drainage becomes persistently bloody, excessively swollen, or intensely painful or develops a foul odor or red streaks.  4. If you should experience mild discomfort during the healing phase, you may take an aspirin-free medication such as Tylenol (acetaminophen). Notify us if the discomfort is severe or persistent. Avoid alcoholic beverages when taking pain medicine.  In Case of Wound Hemorrhage A wound hemorrhage is when the bandage suddenly becomes soaked with bright red blood and flows profusely. If this happens, sit down or lie down with your head elevated. If the wound has a dressing on it, do not remove the dressing. Apply pressure to the existing gauze. If the wound is not covered, use a gauze pad to apply pressure and continue applying the pressure for 20 minutes without peeking. DO NOT COVER THE WOUND WITH   A LARGE TOWEL OR Young CLOTH. Release your hand from the wound site but do not remove the dressing. If the bleeding has stopped, gently clean around the wound. Leave the dressing in place for 24 hours if possible. This wait time allows the blood vessels to close off so that you do not spark a new round of bleeding by disrupting the newly clotted blood vessels with an immediate dressing change. If the bleeding does not subside, continue to hold pressure. If matters are out of your control, contact an After Hours clinic or go to the Emergency Room.   l

## 2019-05-10 ENCOUNTER — Telehealth: Payer: Self-pay

## 2019-05-10 NOTE — Telephone Encounter (Signed)
Talked to patient and he is doing fine from surgery.

## 2019-05-16 ENCOUNTER — Other Ambulatory Visit: Payer: Self-pay

## 2019-05-16 ENCOUNTER — Ambulatory Visit: Payer: BC Managed Care – PPO | Admitting: Dermatology

## 2019-05-16 DIAGNOSIS — D044 Carcinoma in situ of skin of scalp and neck: Secondary | ICD-10-CM | POA: Diagnosis not present

## 2019-05-16 DIAGNOSIS — D239 Other benign neoplasm of skin, unspecified: Secondary | ICD-10-CM

## 2019-05-16 DIAGNOSIS — D099 Carcinoma in situ, unspecified: Secondary | ICD-10-CM

## 2019-05-16 DIAGNOSIS — D225 Melanocytic nevi of trunk: Secondary | ICD-10-CM

## 2019-05-16 NOTE — Patient Instructions (Signed)

## 2019-05-16 NOTE — Progress Notes (Signed)
   Follow-Up Visit   Subjective  Austin Kim is a 64 y.o. male who presents for the following: Skin Cancer (SCCIS on scalp to be treated today with EDC).  Also suture removal for Excision Dysplastic nevus   The following portions of the chart were reviewed this encounter and updated as appropriate:     Review of Systems: No other skin or systemic complaints.  Objective  Well appearing patient in no apparent distress; mood and affect are within normal limits.  A focused examination was performed including scalp, back. Relevant physical exam findings are noted in the Assessment and Plan.  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  Destruction of lesion  Destruction method: electrodesiccation and curettage   Informed consent: discussed and consent obtained   Timeout:  patient name, date of birth, surgical site, and procedure verified Anesthesia: the lesion was anesthetized in a standard fashion   Anesthetic:  1% lidocaine w/ epinephrine 1-100,000 local infiltration Curettage performed in three different directions: Yes   Electrodesiccation performed over the curetted area: Yes   Lesion length (cm):  0.7 Lesion width (cm):  0.7 Margin per side (cm):  0.2 Final wound size (cm):  1.1 Hemostasis achieved with:  pressure, aluminum chloride and electrodesiccation Outcome: patient tolerated procedure well with no complications   Post-procedure details: wound care instructions given    Return in about 6 months (around 11/16/2019) for UBSE.   I, Othelia Pulling, RMA, am acting as scribe for Brendolyn Patty, MD .

## 2019-11-06 ENCOUNTER — Other Ambulatory Visit: Payer: Self-pay

## 2019-11-06 ENCOUNTER — Emergency Department: Payer: BC Managed Care – PPO

## 2019-11-06 ENCOUNTER — Inpatient Hospital Stay
Admission: EM | Admit: 2019-11-06 | Discharge: 2019-11-13 | DRG: 196 | Disposition: A | Discharge status: Home Health | Payer: BC Managed Care – PPO | Attending: Internal Medicine | Admitting: Internal Medicine

## 2019-11-06 DIAGNOSIS — R0602 Shortness of breath: Secondary | ICD-10-CM | POA: Diagnosis not present

## 2019-11-06 DIAGNOSIS — E869 Volume depletion, unspecified: Secondary | ICD-10-CM | POA: Diagnosis present

## 2019-11-06 DIAGNOSIS — E871 Hypo-osmolality and hyponatremia: Secondary | ICD-10-CM | POA: Diagnosis present

## 2019-11-06 DIAGNOSIS — Z833 Family history of diabetes mellitus: Secondary | ICD-10-CM

## 2019-11-06 DIAGNOSIS — J8410 Pulmonary fibrosis, unspecified: Secondary | ICD-10-CM | POA: Diagnosis not present

## 2019-11-06 DIAGNOSIS — Z7952 Long term (current) use of systemic steroids: Secondary | ICD-10-CM

## 2019-11-06 DIAGNOSIS — J849 Interstitial pulmonary disease, unspecified: Secondary | ICD-10-CM | POA: Diagnosis present

## 2019-11-06 DIAGNOSIS — R0902 Hypoxemia: Secondary | ICD-10-CM

## 2019-11-06 DIAGNOSIS — I10 Essential (primary) hypertension: Secondary | ICD-10-CM | POA: Diagnosis present

## 2019-11-06 DIAGNOSIS — Z20822 Contact with and (suspected) exposure to covid-19: Secondary | ICD-10-CM | POA: Diagnosis present

## 2019-11-06 DIAGNOSIS — J439 Emphysema, unspecified: Secondary | ICD-10-CM | POA: Diagnosis present

## 2019-11-06 DIAGNOSIS — R531 Weakness: Secondary | ICD-10-CM

## 2019-11-06 DIAGNOSIS — Z6823 Body mass index (BMI) 23.0-23.9, adult: Secondary | ICD-10-CM

## 2019-11-06 DIAGNOSIS — H5461 Unqualified visual loss, right eye, normal vision left eye: Secondary | ICD-10-CM | POA: Diagnosis present

## 2019-11-06 DIAGNOSIS — Z825 Family history of asthma and other chronic lower respiratory diseases: Secondary | ICD-10-CM

## 2019-11-06 DIAGNOSIS — J189 Pneumonia, unspecified organism: Secondary | ICD-10-CM | POA: Diagnosis present

## 2019-11-06 DIAGNOSIS — J9601 Acute respiratory failure with hypoxia: Secondary | ICD-10-CM | POA: Diagnosis present

## 2019-11-06 DIAGNOSIS — Z8249 Family history of ischemic heart disease and other diseases of the circulatory system: Secondary | ICD-10-CM

## 2019-11-06 DIAGNOSIS — Z79890 Hormone replacement therapy (postmenopausal): Secondary | ICD-10-CM

## 2019-11-06 DIAGNOSIS — Z87891 Personal history of nicotine dependence: Secondary | ICD-10-CM

## 2019-11-06 DIAGNOSIS — J811 Chronic pulmonary edema: Secondary | ICD-10-CM | POA: Diagnosis present

## 2019-11-06 DIAGNOSIS — J441 Chronic obstructive pulmonary disease with (acute) exacerbation: Secondary | ICD-10-CM | POA: Diagnosis present

## 2019-11-06 DIAGNOSIS — E785 Hyperlipidemia, unspecified: Secondary | ICD-10-CM | POA: Diagnosis present

## 2019-11-06 DIAGNOSIS — R5383 Other fatigue: Secondary | ICD-10-CM

## 2019-11-06 DIAGNOSIS — E222 Syndrome of inappropriate secretion of antidiuretic hormone: Secondary | ICD-10-CM | POA: Diagnosis present

## 2019-11-06 DIAGNOSIS — Z800 Family history of malignant neoplasm of digestive organs: Secondary | ICD-10-CM

## 2019-11-06 DIAGNOSIS — Z79899 Other long term (current) drug therapy: Secondary | ICD-10-CM

## 2019-11-06 DIAGNOSIS — R9389 Abnormal findings on diagnostic imaging of other specified body structures: Secondary | ICD-10-CM

## 2019-11-06 DIAGNOSIS — Z85828 Personal history of other malignant neoplasm of skin: Secondary | ICD-10-CM

## 2019-11-06 DIAGNOSIS — R000 Tachycardia, unspecified: Secondary | ICD-10-CM

## 2019-11-06 DIAGNOSIS — J9621 Acute and chronic respiratory failure with hypoxia: Secondary | ICD-10-CM | POA: Diagnosis present

## 2019-11-06 DIAGNOSIS — E440 Moderate protein-calorie malnutrition: Secondary | ICD-10-CM | POA: Insufficient documentation

## 2019-11-06 LAB — CBC
HCT: 38.9 % — ABNORMAL LOW (ref 39.0–52.0)
Hemoglobin: 13.3 g/dL (ref 13.0–17.0)
MCH: 31.1 pg (ref 26.0–34.0)
MCHC: 34.2 g/dL (ref 30.0–36.0)
MCV: 91.1 fL (ref 80.0–100.0)
Platelets: 340 10*3/uL (ref 150–400)
RBC: 4.27 MIL/uL (ref 4.22–5.81)
RDW: 12 % (ref 11.5–15.5)
WBC: 13.8 10*3/uL — ABNORMAL HIGH (ref 4.0–10.5)
nRBC: 0 % (ref 0.0–0.2)

## 2019-11-06 LAB — COMPREHENSIVE METABOLIC PANEL
ALT: 16 U/L (ref 0–44)
AST: 26 U/L (ref 15–41)
Albumin: 3.4 g/dL — ABNORMAL LOW (ref 3.5–5.0)
Alkaline Phosphatase: 75 U/L (ref 38–126)
Anion gap: 10 (ref 5–15)
BUN: 8 mg/dL (ref 8–23)
CO2: 24 mmol/L (ref 22–32)
Calcium: 8.5 mg/dL — ABNORMAL LOW (ref 8.9–10.3)
Chloride: 94 mmol/L — ABNORMAL LOW (ref 98–111)
Creatinine, Ser: 0.77 mg/dL (ref 0.61–1.24)
GFR calc Af Amer: >60 mL/min (ref >60)
GFR calc non Af Amer: >60 mL/min (ref >60)
Glucose, Bld: 136 mg/dL — ABNORMAL HIGH (ref 70–99)
Potassium: 4.1 mmol/L (ref 3.5–5.1)
Sodium: 128 mmol/L — ABNORMAL LOW (ref 135–145)
Total Bilirubin: 1.1 mg/dL (ref 0.3–1.2)
Total Protein: 7.2 g/dL (ref 6.5–8.1)

## 2019-11-06 LAB — PROCALCITONIN: Procalcitonin: 0.12 ng/mL

## 2019-11-06 LAB — OSMOLALITY, URINE: Osmolality, Ur: 543 mOsm/kg (ref 300–900)

## 2019-11-06 LAB — URINALYSIS, ROUTINE W REFLEX MICROSCOPIC
Bilirubin Urine: NEGATIVE
Glucose, UA: NEGATIVE mg/dL
Hgb urine dipstick: NEGATIVE
Ketones, ur: 20 mg/dL — AB
Leukocytes,Ua: NEGATIVE
Nitrite: NEGATIVE
Protein, ur: NEGATIVE mg/dL
Specific Gravity, Urine: 1.039 — ABNORMAL HIGH (ref 1.005–1.030)
pH: 6 (ref 5.0–8.0)

## 2019-11-06 LAB — SODIUM, URINE, RANDOM: Sodium, Ur: 105 mmol/L

## 2019-11-06 LAB — RESP PANEL BY RT PCR (RSV, FLU A&B, COVID)
Influenza A by PCR: NEGATIVE
Influenza B by PCR: NEGATIVE
Respiratory Syncytial Virus by PCR: NEGATIVE
SARS Coronavirus 2 by RT PCR: NEGATIVE

## 2019-11-06 LAB — BRAIN NATRIURETIC PEPTIDE: B Natriuretic Peptide: 121.9 pg/mL — ABNORMAL HIGH (ref 0.0–100.0)

## 2019-11-06 LAB — OSMOLALITY: Osmolality: 269 mOsm/kg — ABNORMAL LOW (ref 275–295)

## 2019-11-06 LAB — TROPONIN I (HIGH SENSITIVITY): Troponin I (High Sensitivity): 16 ng/L (ref <18)

## 2019-11-06 MED ORDER — METOPROLOL TARTRATE 50 MG PO TABS
50.0000 mg | ORAL_TABLET | Freq: Two times a day (BID) | ORAL | Status: DC
  Administered 2019-11-06 – 2019-11-13 (×14): 50 mg via ORAL
  Filled 2019-11-06 (×14): qty 1

## 2019-11-06 MED ORDER — INFLUENZA VAC SPLIT QUAD 0.5 ML IM SUSY: 0.5000 mL | PREFILLED_SYRINGE | INTRAMUSCULAR | Status: DC | PRN

## 2019-11-06 MED ORDER — ALBUTEROL SULFATE (2.5 MG/3ML) 0.083% IN NEBU: 2.5000 mg | INHALATION_SOLUTION | Freq: Four times a day (QID) | RESPIRATORY_TRACT | Status: DC | PRN

## 2019-11-06 MED ORDER — IOHEXOL 350 MG/ML SOLN
75.0000 mL | Freq: Once | INTRAVENOUS | Status: AC | PRN
  Administered 2019-11-06: 75 mL via INTRAVENOUS

## 2019-11-06 MED ORDER — SODIUM CHLORIDE 0.9 % IV SOLN
500.0000 mg | INTRAVENOUS | Status: DC
  Administered 2019-11-06 – 2019-11-08 (×3): 500 mg via INTRAVENOUS
  Filled 2019-11-06 (×5): qty 500

## 2019-11-06 MED ORDER — ENOXAPARIN SODIUM 40 MG/0.4ML ~~LOC~~ SOLN
40.0000 mg | SUBCUTANEOUS | Status: DC
  Administered 2019-11-06 – 2019-11-12 (×7): 40 mg via SUBCUTANEOUS
  Filled 2019-11-06 (×7): qty 0.4

## 2019-11-06 MED ORDER — SODIUM CHLORIDE 0.9 % IV SOLN
2.0000 g | INTRAVENOUS | Status: DC
  Administered 2019-11-06 – 2019-11-08 (×3): 2 g via INTRAVENOUS
  Filled 2019-11-06: qty 2
  Filled 2019-11-06: qty 20
  Filled 2019-11-06: qty 2
  Filled 2019-11-06 (×2): qty 20

## 2019-11-06 MED ORDER — AMLODIPINE BESYLATE 5 MG PO TABS
5.0000 mg | ORAL_TABLET | ORAL | Status: DC
  Administered 2019-11-06 – 2019-11-12 (×7): 5 mg via ORAL
  Filled 2019-11-06 (×5): qty 1

## 2019-11-06 MED ORDER — METHYLPREDNISOLONE SODIUM SUCC 40 MG IJ SOLR
40.0000 mg | Freq: Two times a day (BID) | INTRAMUSCULAR | Status: DC
  Administered 2019-11-06 – 2019-11-10 (×9): 40 mg via INTRAVENOUS
  Filled 2019-11-06 (×9): qty 1

## 2019-11-06 MED ORDER — LACTATED RINGERS IV BOLUS
1000.0000 mL | Freq: Once | INTRAVENOUS | Status: AC
  Administered 2019-11-06: 1000 mL via INTRAVENOUS

## 2019-11-06 NOTE — ED Triage Notes (Signed)
Tested for covid on 09/09 with negative result. For two weeks has been feeling tired with a cough and difficulty breathing. Patient on arrival to ED with oxygen saturation of 88%. Placed on 2 L [place] and sats up to 94%. Patient denies pain, just states that his breathing is getting worse.

## 2019-11-06 NOTE — ED Notes (Signed)
Admitting provider at bedside.

## 2019-11-06 NOTE — H&P (Signed)
History and Physical    Austin Kim HFW:[id number] DOB: 04/03/1955 DOA: 11/06/2019  PCP: Juluis Pitch, MD  Patient coming from: Home  I have personally briefly reviewed patient's old medical records in [place]  Chief Complaint: Shortness of breath  HPI: Austin Kim is a 64 y.o. male with medical history significant for emphysema not requiring home O2, remote history of spontaneous pneumothorax requiring pleurodesis, hypertension, and hyperlipidemia who presents to the ED for evaluation of progressive shortness of breath, dyspnea on exertion, nonproductive cough, and fatigue.  Patient states he has been having several weeks of progressive shortness of breath with dyspnea on minimal exertion, nonproductive cough with chest congestion, fatigue, generalized weakness.  He says he has a cough at his baseline which is more frequent than usual.  He says he is normally very active and walks 22,000 steps a day without issue until his symptoms began.  He has had associated chest tightness.  He has not had any subjective fevers, chills, diaphoresis, nausea, vomiting, abdominal pain, dysuria.  He reports some loose stools but no diarrhea.  He denies any skin changes, rashes, lumps, or bumps.    He says he had a remote history of spontaneous pneumothorax in 2001 requiring pleurodesis.  He was diagnosed with emphysema but was told by his pulmonologist many years ago that this was stable and he did not need regular follow-up.  He does not require oxygen or inhaler treatments at home.  He has not had any recent changes in his medications.  He currently takes amlodipine and metoprolol for blood pressure.  He is a former smoker, previously smoked 2 pack/day for over 20 years, quitting in 2001.  He denies any obvious exposure to asbestos, radiation, or irritants.  He denies any known allergies to medication.  He works as a Environmental education officer.  He says his father has a history of emphysema and  heart transplant.  He says his brother has type 1 diabetes.  ED Course:  Initial vitals showed BP 148/82, pulse 108, RR 20, temp 98.4 Fahrenheit, SPO2 90% on 2 L supplemental O2 via [place]. Per ED documentation on ED arrival O2 saturation was 88% at rest on room air and down to 81% with ambulation.  Labs show WBC 13.8, hemoglobin 13.3, platelets 340,000, sodium 128, potassium 4.1, bicarb 24, BUN 8, creatinine 0.77, serum glucose 136, AST 26, ALT 16, alk phos 75, total bilirubin 1.1, BNP 121.9, high-sensitivity troponin I 16.  Respiratory viral panel showed negative SARS-CoV-2 PCR, influenza A and B PCR, RSV PCR.  2 view chest x-ray showed diffuse coarse interstitial opacities increased in the lung bases when compared to prior in 2019.  CTA chest PE study was negative for evidence of pulmonary emboli.  Persistent chronic fibrotic changes similar to prior were seen as well as progressive acute on chronic infiltrate in the lower lobes bilaterally suggestive of atypical pneumonia.  Patient was given 1 L LR and the hospitalist service was consulted admit for further evaluation and management.  Review of Systems: All systems reviewed and are negative except as documented in history of present illness above.   Past Medical History:  Diagnosis Date  . Blindness of right eye   . COPD (chronic obstructive pulmonary disease) ([employer])   . Elevated lipids   . Hypertension   . Spontaneous pneumothorax     Past Surgical History:  Procedure Laterality Date  . BASAL CELL CARCINOMA EXCISION Bilateral   . COLONOSCOPY WITH PROPOFOL N/A 07/28/2017  Procedure: COLONOSCOPY WITH PROPOFOL;  Surgeon: Lollie Sails, MD;  Location: [facility] ENDOSCOPY;  Service: Endoscopy;  Laterality: N/A;  . EYE SURGERY    . PLEURAL SCARIFICATION      Social History:  reports that he quit smoking about 20 years ago. He has never used smokeless tobacco. He reports current alcohol use. He reports that he does not use drugs.  No  Known Allergies  No family history on file.   Prior to Admission medications   Medication Sig Start Date End Date Taking? Authorizing Provider  amLODipine (NORVASC) 5 MG tablet Take 1 tablet by mouth 1 day or 1 dose. 09/08/18   [provider]  loratadine (CLARITIN) 10 MG tablet Take 10 mg by mouth daily.    [provider]  Melatonin 1 MG TABS Take by mouth.    [provider]  metoprolol tartrate (LOPRESSOR) 50 MG tablet Take 50 mg by mouth 2 (two) times daily. 05/03/19   [provider]    Physical Exam: Vitals:   11/06/19 1745 11/06/19 1800 11/06/19 1830 11/06/19 1845  BP:  (!) 160/80 (!) 163/98   Pulse: (!) 104 100 (!) 111 96  Resp: (!) _0 (!) 26  Temp:      TempSrc:      SpO2: 96% 95% 93% 96%  Weight:      Height:       Constitutional: Resting in bed with head elevated, NAD, calm, comfortable Eyes: EOMI, lids and conjunctivae normal ENMT: Mucous membranes are moist. Posterior pharynx clear of any exudate or lesions.Normal dentition.  Neck: normal, supple, no masses. Respiratory: Distant breath sounds, faint inspiratory crackles.  Normal respiratory effort. No accessory muscle use.  Occasional dry coughing fits. Cardiovascular: Tachycardic, no murmurs / rubs / gallops. No extremity edema. 2+ pedal pulses. Abdomen: no tenderness, no masses palpated. No hepatosplenomegaly. Bowel sounds positive.  Musculoskeletal: no clubbing / cyanosis. No joint deformity upper and lower extremities. Good ROM, no contractures. Normal muscle tone.  Skin: no rashes, lesions, ulcers. No induration Neurologic: CN 2-12 grossly intact. Sensation intact, Strength 5/5 in all 4.  Psychiatric: Normal judgment and insight. Alert and oriented x 3. Normal mood.     Labs on Admission: I have personally reviewed following labs and imaging studies  CBC: Recent Labs  Lab 11/06/19 1042  WBC 13.8*  HGB 13.3  HCT 38.9*  MCV 91.1  PLT 003   Basic Metabolic  Panel: Recent Labs  Lab 11/06/19 1042  NA 128*  K 4.1  CL 94*  CO2 24  GLUCOSE 136*  BUN 8  CREATININE 0.77  CALCIUM 8.5*   GFR: Estimated Creatinine Clearance: 95.7 mL/min (by C-G formula based on SCr of 0.77 mg/dL). Liver Function Tests: Recent Labs  Lab 11/06/19 1042  AST 26  ALT 16  ALKPHOS 75  BILITOT 1.1  PROT 7.2  ALBUMIN 3.4*   No results for input(s): LIPASE, AMYLASE in the last 168 hours. No results for input(s): AMMONIA in the last 168 hours. Coagulation Profile: No results for input(s): INR, PROTIME in the last 168 hours. Cardiac Enzymes: No results for input(s): CKTOTAL, CKMB, CKMBINDEX, TROPONINI in the last 168 hours. BNP (last 3 results) No results for input(s): PROBNP in the last 8760 hours. HbA1C: No results for input(s): HGBA1C in the last 72 hours. CBG: No results for input(s): GLUCAP in the last 168 hours. Lipid Profile: No results for input(s): CHOL, HDL, LDLCALC, TRIG, CHOLHDL, LDLDIRECT in the last 72 hours. Thyroid  Function Tests: No results for input(s): TSH, T4TOTAL, FREET4, T3FREE, THYROIDAB in the last 72 hours. Anemia Panel: No results for input(s): VITAMINB12, FOLATE, FERRITIN, TIBC, IRON, RETICCTPCT in the last 72 hours. Urine analysis:    Component Value Date/Time   COLORURINE AMBER (A) 01/18/2018 1317   APPEARANCEUR HAZY (A) 01/18/2018 1317   LABSPEC 1.015 01/18/2018 1317   PHURINE 7.0 01/18/2018 1317   GLUCOSEU NEGATIVE 01/18/2018 1317   HGBUR NEGATIVE 01/18/2018 1317   BILIRUBINUR NEGATIVE 01/18/2018 1317   KETONESUR 20 (A) 01/18/2018 1317   PROTEINUR NEGATIVE 01/18/2018 1317   NITRITE NEGATIVE 01/18/2018 1317   LEUKOCYTESUR NEGATIVE 01/18/2018 1317    Radiological Exams on Admission: DG Chest 2 View  Result Date: 11/06/2019 CLINICAL DATA:  Hypoxia EXAM: CHEST - 2 VIEW COMPARISON:  January 18, 2018 FINDINGS: The cardiomediastinal silhouette is unchanged in contour.There are diffuse coarse interstitial opacities which  have increased in comparison to prior in 2019. These are predominantly increased in the bases. Trace fluid in the RIGHT fissure. Biapical pleuroparenchymal scarring, unchanged. No pleural effusion. No pneumothorax. Visualized abdomen is unremarkable. Wedging of an upper upper lumbar vertebral body, unchanged. Atherosclerotic calcifications of the aorta. IMPRESSION: Diffuse coarse interstitial opacities, predominantly increased in the lung bases. Findings may reflect worsened underlying pulmonary fibrosis versus superimposed infection or edema on a background of pulmonary fibrosis. Electronically Signed   By: Valentino Saxon MD   On: 11/06/2019 16:55   CT Angio Chest PE W and/or Wo Contrast  Result Date: 11/06/2019 CLINICAL DATA:  Worsening shortness of breath EXAM: CT ANGIOGRAPHY CHEST WITH CONTRAST TECHNIQUE: Multidetector CT imaging of the chest was performed using the standard protocol during bolus administration of intravenous contrast. Multiplanar CT image reconstructions and MIPs were obtained to evaluate the vascular anatomy. CONTRAST:  74m OMNIPAQUE IOHEXOL 350 MG/ML SOLN COMPARISON:  CT from 01/18/2018, chest x-ray from earlier in the same day. FINDINGS: Cardiovascular: Thoracic aorta demonstrates mild atherosclerotic calcifications without aneurysmal dilatation or dissection. No significant cardiac enlargement is seen. Coronary calcifications are noted. The pulmonary artery shows a normal branching pattern without evidence of pulmonary embolism. Mediastinum/Nodes: Thoracic inlet is within normal limits. Scattered small mediastinal and hilar lymph nodes are noted increased from the prior exam but likely of a reactive nature given the changes in lung parenchyma. The esophagus as visualized is within normal limits. Lungs/Pleura: Chronic emphysematous and fibrotic changes are noted within the lungs bilaterally similar to that seen on prior CT examination. Additionally there are patchy predominately  ground-glass opacities identified throughout both lung bases and to a lesser degree in the upper lobes bilaterally new from the prior exam consistent with an acute atypical pneumonia superimposed over the chronic fibrotic changes. No sizable effusion is seen. Previously seen Peri fissural nodularity in the left lower lobe is again identified but slightly less prominent consistent with a benign etiology likely related to scarring. No new sizable parenchymal nodules are seen. Upper Abdomen: Visualized upper abdomen demonstrates a small cyst within the liver stable from the prior exam. Mild adrenal thickening is again noted and stable. No acute abnormality in the upper abdomen is seen. Musculoskeletal: Degenerative changes of the thoracic spine are noted. Review of the MIP images confirms the above findings. IMPRESSION: Progressive acute on chronic infiltrate in the lower lobes bilaterally consistent with atypical pneumonia. No evidence of pulmonary emboli. Persistent chronic fibrotic changes similar to that seen on the prior exam. Aortic Atherosclerosis (ICD10-I70.0) and Emphysema (ICD10-J43.9). Electronically Signed   By: MLinus MakoD.  On: 11/06/2019 19:24    EKG: Independently reviewed. Sinus rhythm, rate 97, motion artifact.  Rate is faster when compared to prior.  Assessment/Plan Principal Problem:   ILD (interstitial lung disease) ([place]) Active Problems:   Hypertension   Acute respiratory failure with hypoxia (HCC)   Hyponatremia   Atypical pneumonia  Austin Kim is a 64 y.o. male with medical history significant for emphysema not requiring home O2, remote history of spontaneous pneumothorax requiring pleurodesis, hypertension, and hyperlipidemia who is admitted with acute respiratory failure with hypoxia.  Acute respiratory failure with hypoxia Atypical pneumonia Pulmonary fibrosis/interstitial lung disease:: Secondary to possible atypical pneumonia and/or interstitial lung disease  based on CT imaging.  He is noted to desaturate to 81% on room air with ambulation.  Currently requiring between 2-4 L O2 via [place]. -Start empiric antibiotics with IV ceftriaxone and azithromycin -Check strep pneumonia and Legionella urinary antigens -SARS-CoV-2, influenza A/B, RSV PCR's are negative -Trial IV Solu-Medrol 40 mg twice daily for suspected ILD -Obtain echocardiogram to assess cardiac function -Check ANA, RF, anti-CCP  Hyponatremia: Mild with sodium 128.  Check urine sodium, serum osmolality and urine osmolality.  Received 1 L fluids in the ED.  Repeat labs in a.m.  Hypertension: Continue home amlodipine and Lopressor.  DVT prophylaxis: Lovenox Code Status: Full code, confirmed with patient Family Communication: Discussed with patient's wife at bedside Disposition Plan: From home and likely discharge to home pending symptomatic improvement and ability to wean off supplemental oxygen Consults called: None Admission status:  Status is: Observation  The patient remains OBS appropriate and will d/c before 2 midnights.  Dispo: The patient is from: Home              Anticipated d/c is to: Home              Anticipated d/c date is: 1 day              Patient currently is not medically stable to d/c.  Zada Finders MD [facility]  If 7PM-7AM, please contact night-coverage [email or web address]  11/06/2019, 7:39 PM

## 2019-11-06 NOTE — ED Provider Notes (Signed)
[facility] Emergency Department Provider Note ____________________________________________   First MD Initiated Contact with Patient 11/06/19 1605     (approximate)  I have reviewed the triage vital signs and the nursing notes.  HISTORY  Chief Complaint Respiratory Distress and Fatigue (x 2 weeks)   HPI Austin Kim is a 64 y.o. malewho presents to the ED for evaluation of shortness of breath and fatigue.   Chart review indicates hx COPD, HTN, HLD.  Patient is fully vaccinated for COVID-19 with full Pfizer series in April 2021.  Patient presents to the ED for evaluation of acute on chronically worsening shortness of breath, generalized weakness and nonproductive cough.  Patient reports the symptoms have been progressively worsening over a matter of months, dramatically worsening over the past 1 week.  Patient is reporting dramatic reduction in exercise tolerance.  He reports walking every morning for about 45 minutes previously and as recently as a couple months ago, now only tolerating 10-15 minutes walks at the most.  He reports poor ambulation throughout his home due to increasing weakness and dyspnea on exertion.  He reports concomitant nonproductive cough.  Denies pain anywhere.   Patient denies changes to taste/smell and denies fevers or productive cough.  Denies chest pain, syncope, vomiting.  Past Medical History:  Diagnosis Date  . Blindness of right eye   . COPD (chronic obstructive pulmonary disease) ([place])   . Elevated lipids   . Hypertension   . Spontaneous pneumothorax     Patient Active Problem List   Diagnosis Date Noted  . ILD (interstitial lung disease) ([place]) 11/06/2019  . Hypertension   . Acute respiratory failure with hypoxia ([place])   . Hyponatremia   . Atypical pneumonia     Past Surgical History:  Procedure Laterality Date  . BASAL CELL CARCINOMA EXCISION Bilateral   . COLONOSCOPY WITH PROPOFOL N/A 07/28/2017    Procedure: COLONOSCOPY WITH PROPOFOL;  Surgeon: Lollie Sails, MD;  Location: [facility] ENDOSCOPY;  Service: Endoscopy;  Laterality: N/A;  . EYE SURGERY    . PLEURAL SCARIFICATION      Prior to Admission medications   Medication Sig Start Date End Date Taking? Authorizing Provider  amLODipine (NORVASC) 5 MG tablet Take 1 tablet by mouth 1 day or 1 dose. 09/08/18  Yes [provider]  metoprolol tartrate (LOPRESSOR) 50 MG tablet Take 50 mg by mouth 2 (two) times daily. 05/03/19  Yes [provider]  loratadine (CLARITIN) 10 MG tablet Take 10 mg by mouth daily.    [provider]    Allergies Patient has no known allergies.  Family History  Problem Relation Age of Onset  . Diabetes Father   . CAD Father   . Colon cancer Father     Social History Social History   Tobacco Use  . Smoking status: Former Smoker    Quit date: 2001    Years since quitting: 20.7  . Smokeless tobacco: Never Used  Vaping Use  . Vaping Use: Never used  Substance Use Topics  . Alcohol use: Yes  . Drug use: No    Review of Systems  Constitutional: No fever/chills.  Positive generalized weakness. Eyes: No visual changes. ENT: No sore throat. Cardiovascular: Denies chest pain. Respiratory: Positive shortness of breath and cough Gastrointestinal: No abdominal pain.  No nausea, no vomiting.  No diarrhea.  No constipation. Genitourinary: Negative for dysuria. Musculoskeletal: Negative for back pain. Skin: Negative for rash. Neurological: Negative for headaches, focal weakness or  numbness.   ____________________________________________   PHYSICAL EXAM:  VITAL SIGNS: Vitals:   11/06/19 1830 11/06/19 1845  BP: (!) 163/98   Pulse: (!) 111 96  Resp: 20 (!) 26  Temp:    SpO2: 93% 96%      Constitutional: Alert and oriented. Well appearing and in no acute distress.  Sitting up in bed, pleasant and well-appearing.  Conversational full sentences.  Mild tachypnea to the  low/mid 20s. Eyes: Conjunctivae are normal. PERRL. EOMI. Head: Atraumatic. Nose: No congestion/rhinnorhea. Mouth/Throat: Mucous membranes are moist.  Oropharynx non-erythematous. Neck: No stridor. No cervical spine tenderness to palpation. Cardiovascular: Tachycardic rate, regular rhythm. Grossly normal heart sounds.  Good peripheral circulation. Respiratory: Normal respiratory effort.  No retractions. Lungs CTAB.  Requiring nasal cannula.  Tachypnea to the mid 20s. Gastrointestinal: Soft , nondistended, nontender to palpation. No abdominal bruits. No CVA tenderness. Musculoskeletal: No lower extremity tenderness nor edema.  No joint effusions. No signs of acute trauma. Neurologic:  Normal speech and language. No gross focal neurologic deficits are appreciated. No gait instability noted. Skin:  Skin is warm, dry and intact. No rash noted. Psychiatric: Mood and affect are normal. Speech and behavior are normal.  ____________________________________________   LABS (all labs ordered are listed, but only abnormal results are displayed)  Labs Reviewed  CBC - Abnormal; Notable for the following components:      Result Value   WBC 13.8 (*)    HCT 38.9 (*)    All other components within normal limits  COMPREHENSIVE METABOLIC PANEL - Abnormal; Notable for the following components:   Sodium 128 (*)    Chloride 94 (*)    Glucose, Bld 136 (*)    Calcium 8.5 (*)    Albumin 3.4 (*)    All other components within normal limits  BRAIN NATRIURETIC PEPTIDE - Abnormal; Notable for the following components:   B Natriuretic Peptide 121.9 (*)    All other components within normal limits  RESP PANEL BY RT PCR (RSV, FLU A&B, COVID)  URINALYSIS, ROUTINE W REFLEX MICROSCOPIC  SODIUM, URINE, RANDOM  OSMOLALITY, URINE  PROCALCITONIN  OSMOLALITY  TROPONIN I (HIGH SENSITIVITY)   ____________________________________________  12 Lead EKG  Sinus rhythm, rate of 97 bpm.  Normal axis and intervals.  No  evidence of acute ischemia. ____________________________________________  RADIOLOGY  ED MD interpretation: 2 view CXR with increased interstitial markings consistent with pulmonary fibrosis versus atypical infection.  No discrete lobar filtration.  No evidence of pulmonary edema or pleural effusions  Official radiology report(s): DG Chest 2 View  Result Date: 11/06/2019 CLINICAL DATA:  Hypoxia EXAM: CHEST - 2 VIEW COMPARISON:  January 18, 2018 FINDINGS: The cardiomediastinal silhouette is unchanged in contour.There are diffuse coarse interstitial opacities which have increased in comparison to prior in 2019. These are predominantly increased in the bases. Trace fluid in the RIGHT fissure. Biapical pleuroparenchymal scarring, unchanged. No pleural effusion. No pneumothorax. Visualized abdomen is unremarkable. Wedging of an upper upper lumbar vertebral body, unchanged. Atherosclerotic calcifications of the aorta. IMPRESSION: Diffuse coarse interstitial opacities, predominantly increased in the lung bases. Findings may reflect worsened underlying pulmonary fibrosis versus superimposed infection or edema on a background of pulmonary fibrosis. Electronically Signed   By: Valentino Saxon MD   On: 11/06/2019 16:55   CT Angio Chest PE W and/or Wo Contrast  Result Date: 11/06/2019 CLINICAL DATA:  Worsening shortness of breath EXAM: CT ANGIOGRAPHY CHEST WITH CONTRAST TECHNIQUE: Multidetector CT imaging of the chest was performed using the standard  protocol during bolus administration of intravenous contrast. Multiplanar CT image reconstructions and MIPs were obtained to evaluate the vascular anatomy. CONTRAST:  78mL OMNIPAQUE IOHEXOL 350 MG/ML SOLN COMPARISON:  CT from 01/18/2018, chest x-ray from earlier in the same day. FINDINGS: Cardiovascular: Thoracic aorta demonstrates mild atherosclerotic calcifications without aneurysmal dilatation or dissection. No significant cardiac enlargement is seen. Coronary  calcifications are noted. The pulmonary artery shows a normal branching pattern without evidence of pulmonary embolism. Mediastinum/Nodes: Thoracic inlet is within normal limits. Scattered small mediastinal and hilar lymph nodes are noted increased from the prior exam but likely of a reactive nature given the changes in lung parenchyma. The esophagus as visualized is within normal limits. Lungs/Pleura: Chronic emphysematous and fibrotic changes are noted within the lungs bilaterally similar to that seen on prior CT examination. Additionally there are patchy predominately ground-glass opacities identified throughout both lung bases and to a lesser degree in the upper lobes bilaterally new from the prior exam consistent with an acute atypical pneumonia superimposed over the chronic fibrotic changes. No sizable effusion is seen. Previously seen Peri fissural nodularity in the left lower lobe is again identified but slightly less prominent consistent with a benign etiology likely related to scarring. No new sizable parenchymal nodules are seen. Upper Abdomen: Visualized upper abdomen demonstrates a small cyst within the liver stable from the prior exam. Mild adrenal thickening is again noted and stable. No acute abnormality in the upper abdomen is seen. Musculoskeletal: Degenerative changes of the thoracic spine are noted. Review of the MIP images confirms the above findings. IMPRESSION: Progressive acute on chronic infiltrate in the lower lobes bilaterally consistent with atypical pneumonia. No evidence of pulmonary emboli. Persistent chronic fibrotic changes similar to that seen on the prior exam. Aortic Atherosclerosis (ICD10-I70.0) and Emphysema (ICD10-J43.9). Electronically Signed   By: Inez Catalina M.D.   On: 11/06/2019 19:24    ____________________________________________   PROCEDURES and INTERVENTIONS  Procedure(s) performed (including Critical Care):  Procedures  Medications  lactated ringers bolus  1,000 mL (0 mLs Intravenous Stopped 11/06/19 1921)  iohexol (OMNIPAQUE) 350 MG/ML injection 75 mL (75 mLs Intravenous Contrast Given 11/06/19 1903)    ____________________________________________   MDM / ED COURSE  64 year old male presents from home with progressively worsening dyspnea on exertion and generalized weakness concerning for progression of pulmonary fibrosis versus COVID-19 testing negative, ultimately requiring medical admission for his hypoxia.  Patient requiring nasal cannula for normoxia, otherwise hemodynamically stable.  Exam shows tachypnea and dyspnea, but no overt distress.  He has no neurovascular deficits or signs of trauma.  Blood work largely unremarkable.  CXR with diffuse increased interstitial markings consistent with fibrosis versus atypical infection.  With his months of progressively worsening symptoms, pulmonary fibrosis is very high on my differential.  With his past 1 week of vague feeling poorly, decreased appetite and generalized weakness, and viral infection such as COVID-19 is also probable.  Patient tested negative for COVID-19.  Due to his tachycardia and hypoxia, CTA chest obtained and without evidence of PE.  CT does demonstrate evidence of multifocal pneumonia, further raising concern for possible atypical viral infection.  Will admit the patient to hospitalist medicine for further work-up and management of his hypoxic respiratory condition.  Clinical Course as of Nov 06 1954  Nancy Fetter Nov 06, 2019  1900 Educated patient on work-up with concern for pulmonary fibrosis.  No evidence of COVID-19.  Patient willing to CTA right now.  Awaiting hospitalist callback for admission.   [DS]  [place]  with admitting hospitalist who agrees to see the patient for admission   [DS]    Clinical Course User Index [DS] Vladimir Crofts, MD     ____________________________________________   FINAL CLINICAL IMPRESSION(S) / ED DIAGNOSES  Final diagnoses:  Other fatigue    Weakness  Sinus tachycardia  Hypoxia  Shortness of breath     ED Discharge Orders    None       Vernona Peake Tamala Julian   Note:  This document was prepared using Dragon voice recognition software and may include unintentional dictation errors.   Vladimir Crofts, MD 11/06/19 1958

## 2019-11-06 NOTE — ED Notes (Signed)
CT tech notified nurse pt would like to get up and go to the bathroom. This nurse at bedside. Pt got up to use bathroom. Pt became increasingly short of breath when walking to and from toilet. Pt oxygen saturation when reconnected to monitor was 81 percent. Pt seated back onto stretcher and placed on 4l nasal cannula. Oxygen saturation increased to 98 percent. Respiratory rate decreased to rate of 22 once seated.

## 2019-11-07 ENCOUNTER — Inpatient Hospital Stay
Admit: 2019-11-07 | Discharge: 2019-11-07 | Disposition: A | Discharge status: Home / Self Care | Payer: BC Managed Care – PPO | Attending: Internal Medicine | Admitting: Internal Medicine

## 2019-11-07 ENCOUNTER — Encounter: Payer: Self-pay | Admitting: Internal Medicine

## 2019-11-07 DIAGNOSIS — E869 Volume depletion, unspecified: Secondary | ICD-10-CM | POA: Diagnosis present

## 2019-11-07 DIAGNOSIS — E440 Moderate protein-calorie malnutrition: Secondary | ICD-10-CM | POA: Diagnosis present

## 2019-11-07 DIAGNOSIS — I10 Essential (primary) hypertension: Secondary | ICD-10-CM | POA: Diagnosis present

## 2019-11-07 DIAGNOSIS — Z87891 Personal history of nicotine dependence: Secondary | ICD-10-CM | POA: Diagnosis not present

## 2019-11-07 DIAGNOSIS — J849 Interstitial pulmonary disease, unspecified: Secondary | ICD-10-CM | POA: Diagnosis not present

## 2019-11-07 DIAGNOSIS — J439 Emphysema, unspecified: Secondary | ICD-10-CM | POA: Diagnosis present

## 2019-11-07 DIAGNOSIS — Z85828 Personal history of other malignant neoplasm of skin: Secondary | ICD-10-CM | POA: Diagnosis not present

## 2019-11-07 DIAGNOSIS — E222 Syndrome of inappropriate secretion of antidiuretic hormone: Secondary | ICD-10-CM | POA: Diagnosis present

## 2019-11-07 DIAGNOSIS — Z7952 Long term (current) use of systemic steroids: Secondary | ICD-10-CM | POA: Diagnosis not present

## 2019-11-07 DIAGNOSIS — E871 Hypo-osmolality and hyponatremia: Secondary | ICD-10-CM

## 2019-11-07 DIAGNOSIS — J189 Pneumonia, unspecified organism: Secondary | ICD-10-CM

## 2019-11-07 DIAGNOSIS — J9601 Acute respiratory failure with hypoxia: Secondary | ICD-10-CM

## 2019-11-07 DIAGNOSIS — J811 Chronic pulmonary edema: Secondary | ICD-10-CM | POA: Diagnosis present

## 2019-11-07 DIAGNOSIS — J8410 Pulmonary fibrosis, unspecified: Secondary | ICD-10-CM | POA: Diagnosis present

## 2019-11-07 DIAGNOSIS — Z825 Family history of asthma and other chronic lower respiratory diseases: Secondary | ICD-10-CM | POA: Diagnosis not present

## 2019-11-07 DIAGNOSIS — Z6823 Body mass index (BMI) 23.0-23.9, adult: Secondary | ICD-10-CM | POA: Diagnosis not present

## 2019-11-07 DIAGNOSIS — E785 Hyperlipidemia, unspecified: Secondary | ICD-10-CM | POA: Diagnosis present

## 2019-11-07 DIAGNOSIS — Z8249 Family history of ischemic heart disease and other diseases of the circulatory system: Secondary | ICD-10-CM | POA: Diagnosis not present

## 2019-11-07 DIAGNOSIS — Z79890 Hormone replacement therapy (postmenopausal): Secondary | ICD-10-CM | POA: Diagnosis not present

## 2019-11-07 DIAGNOSIS — Z79899 Other long term (current) drug therapy: Secondary | ICD-10-CM | POA: Diagnosis not present

## 2019-11-07 DIAGNOSIS — Z800 Family history of malignant neoplasm of digestive organs: Secondary | ICD-10-CM | POA: Diagnosis not present

## 2019-11-07 DIAGNOSIS — H5461 Unqualified visual loss, right eye, normal vision left eye: Secondary | ICD-10-CM | POA: Diagnosis present

## 2019-11-07 DIAGNOSIS — J9621 Acute and chronic respiratory failure with hypoxia: Secondary | ICD-10-CM | POA: Diagnosis present

## 2019-11-07 DIAGNOSIS — Z20822 Contact with and (suspected) exposure to covid-19: Secondary | ICD-10-CM | POA: Diagnosis present

## 2019-11-07 DIAGNOSIS — Z833 Family history of diabetes mellitus: Secondary | ICD-10-CM | POA: Diagnosis not present

## 2019-11-07 DIAGNOSIS — R0602 Shortness of breath: Secondary | ICD-10-CM | POA: Diagnosis present

## 2019-11-07 LAB — CBC
HCT: 37 % — ABNORMAL LOW (ref 39.0–52.0)
Hemoglobin: 13 g/dL (ref 13.0–17.0)
MCH: 31.1 pg (ref 26.0–34.0)
MCHC: 35.1 g/dL (ref 30.0–36.0)
MCV: 88.5 fL (ref 80.0–100.0)
Platelets: 325 10*3/uL (ref 150–400)
RBC: 4.18 MIL/uL — ABNORMAL LOW (ref 4.22–5.81)
RDW: 11.9 % (ref 11.5–15.5)
WBC: 8.4 10*3/uL (ref 4.0–10.5)
nRBC: 0 % (ref 0.0–0.2)

## 2019-11-07 LAB — BASIC METABOLIC PANEL
Anion gap: 10 (ref 5–15)
BUN: 10 mg/dL (ref 8–23)
CO2: 25 mmol/L (ref 22–32)
Calcium: 8.6 mg/dL — ABNORMAL LOW (ref 8.9–10.3)
Chloride: 98 mmol/L (ref 98–111)
Creatinine, Ser: 0.79 mg/dL (ref 0.61–1.24)
GFR calc Af Amer: >60 mL/min (ref >60)
GFR calc non Af Amer: >60 mL/min (ref >60)
Glucose, Bld: 146 mg/dL — ABNORMAL HIGH (ref 70–99)
Potassium: 4.5 mmol/L (ref 3.5–5.1)
Sodium: 133 mmol/L — ABNORMAL LOW (ref 135–145)

## 2019-11-07 LAB — HIV ANTIBODY (ROUTINE TESTING W REFLEX): HIV Screen 4th Generation wRfx: NONREACTIVE

## 2019-11-07 LAB — STREP PNEUMONIAE URINARY ANTIGEN: Strep Pneumo Urinary Antigen: NEGATIVE

## 2019-11-07 MED ORDER — GUAIFENESIN 100 MG/5ML PO SOLN
5.0000 mL | ORAL | Status: DC | PRN
  Administered 2019-11-07 – 2019-11-12 (×5): 100 mg via ORAL
  Filled 2019-11-07 (×11): qty 5

## 2019-11-07 NOTE — Progress Notes (Signed)
Progress Note    Austin Kim  NGE:[id number] DOB: 05-May-1955  DOA: 11/06/2019 PCP: Juluis Pitch, MD      Brief Narrative:    Medical records reviewed and are as summarized below:  Austin Kim is a 64 y.o. male       Assessment/Plan:   Principal Problem:   ILD (interstitial lung disease) ([place]) Active Problems:   Hypertension   Acute respiratory failure with hypoxia ([place])   Hyponatremia   Atypical pneumonia  Body mass index is 23.63 kg/m.    PLAN   Oxygen desaturation dropped to 83% on room air today.  Continue 3 L/min oxygen via nasal cannula.  Suspect patient may have underlying chronic hypoxemic respiratory failure.  Taper off oxygen as able.  Continue IV Rocephin and azithromycin for pneumonia.  Continue IV steroids and bronchodilators for COPD exacerbation.  Sodium level has improved from 128-133.  Continue to monitor levels.  Continue antihypertensives   Diet Order            Diet Heart Room service appropriate? Yes; Fluid consistency: Thin  Diet effective now                    Consultants:  None  Procedures:  None    Medications:   . amLODipine  5 mg Oral 1 day or 1 dose  . enoxaparin (LOVENOX) injection  40 mg Subcutaneous Q24H  . methylPREDNISolone (SOLU-MEDROL) injection  40 mg Intravenous Q12H  . metoprolol tartrate  50 mg Oral BID   Continuous Infusions: . azithromycin Stopped (11/06/19 2322)  . cefTRIAXone (ROCEPHIN)  IV Stopped (11/06/19 2203)     Anti-infectives (From admission, onward)   Start     Dose/Rate Route Frequency Ordered Stop   11/06/19 2100  cefTRIAXone (ROCEPHIN) 2 g in sodium chloride 0.9 % 100 mL IVPB        2 g 200 mL/hr over 30 Minutes Intravenous Every 24 hours 11/06/19 2010 11/11/19 2059   11/06/19 2100  azithromycin (ZITHROMAX) 500 mg in sodium chloride 0.9 % 250 mL IVPB        500 mg 250 mL/hr over 60 Minutes Intravenous Every 24 hours 11/06/19 2010 11/11/19 2059              Family Communication/Anticipated D/C date and plan/Code Status   DVT prophylaxis: enoxaparin (LOVENOX) injection 40 mg Start: 11/06/19 2200     Code Status: Full Code  Family Communication: Plan discussed with patient Disposition Plan:    Status is: Observation  The patient will require care spanning > 2 midnights and should be moved to inpatient because: IV treatments appropriate due to intensity of illness or inability to take PO and Inpatient level of care appropriate due to severity of illness  Dispo: The patient is from: Home              Anticipated d/c is to: Home              Anticipated d/c date is: 2 days              Patient currently is not medically stable to d/c.           Subjective:   C/o shortness of breath, dry cough  Objective:    Vitals:   11/07/19 0905 11/07/19 1000 11/07/19 1010 11/07/19 1015  BP: (!) 149/81     Pulse: (!) 102     Resp: 20  Temp:      TempSrc:      SpO2: 100% 93% (!) 83% 94%  Weight:      Height:       No data found.   Intake/Output Summary (Last 24 hours) at 11/07/2019 1141 Last data filed at 11/07/2019 0700 Gross per 24 hour  Intake 590 ml  Output 1100 ml  Net -510 ml   Filed Weights   11/06/19 1038  Weight: 72.6 kg    Exam:  GEN: NAD SKIN: No rash EYES: No pallor or icterus.  Blind in right eye. ENT: MMM CV: RRR PULM: No wheezing heard.  Faint crackles at the bases. ABD: soft, ND, NT, +BS CNS: AAO x 3, non focal EXT: No edema or tenderness   Data Reviewed:   I have personally reviewed following labs and imaging studies:  Labs: Labs show the following:   Basic Metabolic Panel: Recent Labs  Lab 11/06/19 1042 11/07/19 0654  NA 128* 133*  K 4.1 4.5  CL 94* 98  CO2 24 25  GLUCOSE 136* 146*  BUN 8 10  CREATININE 0.77 0.79  CALCIUM 8.5* 8.6*   GFR Estimated Creatinine Clearance: 95.7 mL/min (by C-G formula based on SCr of 0.79 mg/dL). Liver Function Tests: Recent  Labs  Lab 11/06/19 1042  AST 26  ALT 16  ALKPHOS 75  BILITOT 1.1  PROT 7.2  ALBUMIN 3.4*   No results for input(s): LIPASE, AMYLASE in the last 168 hours. No results for input(s): AMMONIA in the last 168 hours. Coagulation profile No results for input(s): INR, PROTIME in the last 168 hours.  CBC: Recent Labs  Lab 11/06/19 1042 11/07/19 0654  WBC 13.8* 8.4  HGB 13.3 13.0  HCT 38.9* 37.0*  MCV 91.1 88.5  PLT 340 325   Cardiac Enzymes: No results for input(s): CKTOTAL, CKMB, CKMBINDEX, TROPONINI in the last 168 hours. BNP (last 3 results) No results for input(s): PROBNP in the last 8760 hours. CBG: No results for input(s): GLUCAP in the last 168 hours. D-Dimer: No results for input(s): DDIMER in the last 72 hours. Hgb A1c: No results for input(s): HGBA1C in the last 72 hours. Lipid Profile: No results for input(s): CHOL, HDL, LDLCALC, TRIG, CHOLHDL, LDLDIRECT in the last 72 hours. Thyroid function studies: No results for input(s): TSH, T4TOTAL, T3FREE, THYROIDAB in the last 72 hours.  Invalid input(s): FREET3 Anemia work up: No results for input(s): VITAMINB12, FOLATE, FERRITIN, TIBC, IRON, RETICCTPCT in the last 72 hours. Sepsis Labs: Recent Labs  Lab 11/06/19 1042 11/06/19 1729 11/07/19 0654  PROCALCITON  --  0.12  --   WBC 13.8*  --  8.4    Microbiology Recent Results (from the past 240 hour(s))  Resp Panel by RT PCR (RSV, Flu A&B, Covid) - Nasopharyngeal Swab     Status: None   Collection Time: 11/06/19  5:29 PM   Specimen: Nasopharyngeal Swab  Result Value Ref Range Status   SARS Coronavirus 2 by RT PCR NEGATIVE NEGATIVE Final    Comment: (NOTE) SARS-CoV-2 target nucleic acids are NOT DETECTED.  The SARS-CoV-2 RNA is generally detectable in upper respiratoy specimens during the acute phase of infection. The lowest concentration of SARS-CoV-2 viral copies this assay can detect is 131 copies/mL. A negative result does not preclude  SARS-Cov-2 infection and should not be used as the sole basis for treatment or other patient management decisions. A negative result may occur with  improper specimen collection/handling, submission of specimen other than nasopharyngeal swab, presence  of viral mutation(s) within the areas targeted by this assay, and inadequate number of viral copies (<131 copies/mL). A negative result must be combined with clinical observations, patient history, and epidemiological information. The expected result is Negative.  Fact Sheet for Patients:  PinkCheek.be  Fact Sheet for Healthcare Providers:  GravelBags.it  This test is no t yet approved or cleared by the [place] FDA and  has been authorized for detection and/or diagnosis of SARS-CoV-2 by FDA under an Emergency Use Authorization (EUA). This EUA will remain  in effect (meaning this test can be used) for the duration of the COVID-19 declaration under Section 564(b)(1) of the Act, 21 U.S.C. section 360bbb-3(b)(1), unless the authorization is terminated or revoked sooner.     Influenza A by PCR NEGATIVE NEGATIVE Final   Influenza B by PCR NEGATIVE NEGATIVE Final    Comment: (NOTE) The Xpert Xpress SARS-CoV-2/FLU/RSV assay is intended as an aid in  the diagnosis of influenza from Nasopharyngeal swab specimens and  should not be used as a sole basis for treatment. Nasal washings and  aspirates are unacceptable for Xpert Xpress SARS-CoV-2/FLU/RSV  testing.  Fact Sheet for Patients: PinkCheek.be  Fact Sheet for Healthcare Providers: GravelBags.it  This test is not yet approved or cleared by the [place] FDA and  has been authorized for detection and/or diagnosis of SARS-CoV-2 by  FDA under an Emergency Use Authorization (EUA). This EUA will remain  in effect (meaning this test can be used) for the duration of the   Covid-19 declaration under Section 564(b)(1) of the Act, 21  U.S.C. section 360bbb-3(b)(1), unless the authorization is  terminated or revoked.    Respiratory Syncytial Virus by PCR NEGATIVE NEGATIVE Final    Comment: (NOTE) Fact Sheet for Patients: PinkCheek.be  Fact Sheet for Healthcare Providers: GravelBags.it  This test is not yet approved or cleared by the [place] FDA and  has been authorized for detection and/or diagnosis of SARS-CoV-2 by  FDA under an Emergency Use Authorization (EUA). This EUA will remain  in effect (meaning this test can be used) for the duration of the  COVID-19 declaration under Section 564(b)(1) of the Act, 21 U.S.C.  section 360bbb-3(b)(1), unless the authorization is terminated or  revoked. Performed at [facility], [address] [place], [place] [address]     Procedures and diagnostic studies:  DG Chest 2 View  Result Date: 11/06/2019 CLINICAL DATA:  Hypoxia EXAM: CHEST - 2 VIEW COMPARISON:  January 18, 2018 FINDINGS: The cardiomediastinal silhouette is unchanged in contour.There are diffuse coarse interstitial opacities which have increased in comparison to prior in 2019. These are predominantly increased in the bases. Trace fluid in the RIGHT fissure. Biapical pleuroparenchymal scarring, unchanged. No pleural effusion. No pneumothorax. Visualized abdomen is unremarkable. Wedging of an upper upper lumbar vertebral body, unchanged. Atherosclerotic calcifications of the aorta. IMPRESSION: Diffuse coarse interstitial opacities, predominantly increased in the lung bases. Findings may reflect worsened underlying pulmonary fibrosis versus superimposed infection or edema on a background of pulmonary fibrosis. Electronically Signed   By: Valentino Saxon MD   On: 11/06/2019 16:55   CT Angio Chest PE W and/or Wo Contrast  Result Date: 11/06/2019 CLINICAL DATA:  Worsening shortness  of breath EXAM: CT ANGIOGRAPHY CHEST WITH CONTRAST TECHNIQUE: Multidetector CT imaging of the chest was performed using the standard protocol during bolus administration of intravenous contrast. Multiplanar CT image reconstructions and MIPs were obtained to evaluate the vascular anatomy. CONTRAST:  44mL OMNIPAQUE IOHEXOL 350 MG/ML SOLN  COMPARISON:  CT from 01/18/2018, chest x-ray from earlier in the same day. FINDINGS: Cardiovascular: Thoracic aorta demonstrates mild atherosclerotic calcifications without aneurysmal dilatation or dissection. No significant cardiac enlargement is seen. Coronary calcifications are noted. The pulmonary artery shows a normal branching pattern without evidence of pulmonary embolism. Mediastinum/Nodes: Thoracic inlet is within normal limits. Scattered small mediastinal and hilar lymph nodes are noted increased from the prior exam but likely of a reactive nature given the changes in lung parenchyma. The esophagus as visualized is within normal limits. Lungs/Pleura: Chronic emphysematous and fibrotic changes are noted within the lungs bilaterally similar to that seen on prior CT examination. Additionally there are patchy predominately ground-glass opacities identified throughout both lung bases and to a lesser degree in the upper lobes bilaterally new from the prior exam consistent with an acute atypical pneumonia superimposed over the chronic fibrotic changes. No sizable effusion is seen. Previously seen Peri fissural nodularity in the left lower lobe is again identified but slightly less prominent consistent with a benign etiology likely related to scarring. No new sizable parenchymal nodules are seen. Upper Abdomen: Visualized upper abdomen demonstrates a small cyst within the liver stable from the prior exam. Mild adrenal thickening is again noted and stable. No acute abnormality in the upper abdomen is seen. Musculoskeletal: Degenerative changes of the thoracic spine are noted. Review  of the MIP images confirms the above findings. IMPRESSION: Progressive acute on chronic infiltrate in the lower lobes bilaterally consistent with atypical pneumonia. No evidence of pulmonary emboli. Persistent chronic fibrotic changes similar to that seen on the prior exam. Aortic Atherosclerosis (ICD10-I70.0) and Emphysema (ICD10-J43.9). Electronically Signed   By: Inez Catalina M.D.   On: 11/06/2019 19:24               LOS: 0 days   Alick Lecomte  [facility]   Pager on [email or web address]. If 7PM-7AM, please contact night-coverage at [email or web address]     11/07/2019, 11:41 AM

## 2019-11-07 NOTE — Treatment Plan (Signed)
Pt would like paper tape only as he has redness noted to right mid arm from tape from lab. Redness noted to crease at elbow. Area cleansed with secura and barrier ointment applied.

## 2019-11-07 NOTE — Treatment Plan (Signed)
Patient wants norvasc and metoprolol at 1930 this evening as he takes this at home at 7pm and wants to stay on home schedule.

## 2019-11-08 DIAGNOSIS — J441 Chronic obstructive pulmonary disease with (acute) exacerbation: Secondary | ICD-10-CM | POA: Diagnosis present

## 2019-11-08 LAB — LEGIONELLA PNEUMOPHILA SEROGP 1 UR AG: L. pneumophila Serogp 1 Ur Ag: NEGATIVE

## 2019-11-08 LAB — BASIC METABOLIC PANEL
Anion gap: 9 (ref 5–15)
BUN: 17 mg/dL (ref 8–23)
CO2: 28 mmol/L (ref 22–32)
Calcium: 8.8 mg/dL — ABNORMAL LOW (ref 8.9–10.3)
Chloride: 96 mmol/L — ABNORMAL LOW (ref 98–111)
Creatinine, Ser: 0.74 mg/dL (ref 0.61–1.24)
GFR calc Af Amer: >60 mL/min (ref >60)
GFR calc non Af Amer: >60 mL/min (ref >60)
Glucose, Bld: 147 mg/dL — ABNORMAL HIGH (ref 70–99)
Potassium: 4.3 mmol/L (ref 3.5–5.1)
Sodium: 133 mmol/L — ABNORMAL LOW (ref 135–145)

## 2019-11-08 LAB — ECHOCARDIOGRAM COMPLETE
Area-P 1/2: 4.06 cm2
Height: 69 in
S' Lateral: 2.82 cm
Weight: 2560 oz

## 2019-11-08 LAB — RHEUMATOID FACTOR: Rheumatoid fact SerPl-aCnc: 26.7 IU/mL — ABNORMAL HIGH (ref 0.0–13.9)

## 2019-11-08 LAB — CYCLIC CITRUL PEPTIDE ANTIBODY, IGG/IGA: CCP Antibodies IgG/IgA: 5 units (ref 0–19)

## 2019-11-08 LAB — ANA W/REFLEX IF POSITIVE: Anti Nuclear Antibody (ANA): NEGATIVE

## 2019-11-08 MED ORDER — HYDROCODONE-HOMATROPINE 5-1.5 MG/5ML PO SYRP
5.0000 mL | ORAL_SOLUTION | Freq: Four times a day (QID) | ORAL | Status: DC | PRN
  Administered 2019-11-08 – 2019-11-13 (×6): 5 mL via ORAL
  Filled 2019-11-08 (×6): qty 5

## 2019-11-08 NOTE — Progress Notes (Addendum)
Progress Note    Austin Kim  IRJ:[id number] DOB: 05/17/1955  DOA: 11/06/2019 PCP: Juluis Pitch, MD      Brief Narrative:    Medical records reviewed and are as summarized below:  Austin Kim is a 64 y.o. male  with medical history significant for emphysema not requiring home O2,  former smoker (quit smoking cigarettes in 2002), remote history of spontaneous pneumothorax requiring pleurodesis, hypertension, and hyperlipidemia.  He presented to the hospital because of shortness of breath that is worse with exertion, nonproductive cough, generalized weakness and fatigue. His oxygen saturation was 88% on room air and went down to 81% with ambulation.  CTA chest showed persistent chronic fibrotic changes and progressive acute on chronic infiltration bilateral lower lobes suggestive of atypical pneumonia.  He was admitted to the hospital for pneumonia, COPD exacerbation and acute hypoxemic respiratory failure.  He was treated with IV antibiotics, steroids and bronchodilators     Assessment/Plan:   Principal Problem:   ILD (interstitial lung disease) ([place]) Active Problems:   Hypertension   Acute respiratory failure with hypoxia (HCC)   Hyponatremia   Atypical pneumonia   COPD with acute exacerbation (HCC)  Body mass index is 23.63 kg/m.    PLAN  Oxygen saturation dropped to 79% on 3 L/min oxygen when he ambulated to the bathroom.  Continue oxygen via nasal cannula and taper off as able.  He has been told that he may need home oxygen for possible underlying chronic hypoxemic respiratory failure.  Continue empiric IV antibiotics for pneumonia.  Hycodan syrup has been prescribed for severe cough.  Continue Robitussin as needed.  Continue IV steroids and bronchodilators  Continue antihypertensives  Repeat BMP today  Outpatient follow-up with pulmonologist as recommended.  2D echo showed EF estimated at 60 to 30% and LV diastolic parameters were  normal.   Diet Order            Diet Heart Room service appropriate? Yes; Fluid consistency: Thin  Diet effective now                    Consultants:  None  Procedures:  None    Medications:   . amLODipine  5 mg Oral 1 day or 1 dose  . enoxaparin (LOVENOX) injection  40 mg Subcutaneous Q24H  . methylPREDNISolone (SOLU-MEDROL) injection  40 mg Intravenous Q12H  . metoprolol tartrate  50 mg Oral BID   Continuous Infusions: . azithromycin Stopped (11/08/19 0047)  . cefTRIAXone (ROCEPHIN)  IV Stopped (11/07/19 2330)     Anti-infectives (From admission, onward)   Start     Dose/Rate Route Frequency Ordered Stop   11/06/19 2100  cefTRIAXone (ROCEPHIN) 2 g in sodium chloride 0.9 % 100 mL IVPB        2 g 200 mL/hr over 30 Minutes Intravenous Every 24 hours 11/06/19 2010 11/11/19 2059   11/06/19 2100  azithromycin (ZITHROMAX) 500 mg in sodium chloride 0.9 % 250 mL IVPB        500 mg 250 mL/hr over 60 Minutes Intravenous Every 24 hours 11/06/19 2010 11/11/19 2059             Family Communication/Anticipated D/C date and plan/Code Status   DVT prophylaxis: enoxaparin (LOVENOX) injection 40 mg Start: 11/06/19 2200     Code Status: Full Code  Family Communication: Plan discussed with his wife at the bedside Disposition Plan:    Status is: Inpatient  Remains inpatient appropriate because:IV  treatments appropriate due to intensity of illness or inability to take PO and Inpatient level of care appropriate due to severity of illness   Dispo: The patient is from: Home              Anticipated d/c is to: Home              Anticipated d/c date is: 1 day              Patient currently is not medically stable to d/c.                Subjective:   He complains of severe productive cough.  Robitussin is not helping.  He also reports shortness of breath with activity.  He said his oxygen dropped to 79% when he walked to the bathroom even while on 3  L/min oxygen.  Objective:    Vitals:   11/07/19 1342 11/07/19 2008 11/08/19 0531 11/08/19 0728  BP: 125/67 138/89 (!) 145/83 (!) 143/88  Pulse: (!) 109 95 91 95  Resp: 20 19 19 20   Temp: 97.7 F (36.5 C) 98 F (36.7 C) 97.6 F (36.4 C)   TempSrc: Oral Oral Oral   SpO2: 96% 98% 97%   Weight:      Height:       No data found.   Intake/Output Summary (Last 24 hours) at 11/08/2019 1015 Last data filed at 11/08/2019 6237 Gross per 24 hour  Intake 584.4 ml  Output 1030 ml  Net -445.6 ml   Filed Weights   11/06/19 1038  Weight: 72.6 kg    Exam:  GEN: NAD SKIN: No rash EYES: Anicteric, blind in right eye ENT: MMM CV: RRR PULM: No wheezing heard, bibasilar rales ABD: soft, ND, NT, +BS CNS: AAO x 3, non focal EXT: No edema or tenderness    Data Reviewed:   I have personally reviewed following labs and imaging studies:  Labs: Labs show the following:   Basic Metabolic Panel: Recent Labs  Lab 11/06/19 1042 11/07/19 0654  NA 128* 133*  K 4.1 4.5  CL 94* 98  CO2 24 25  GLUCOSE 136* 146*  BUN 8 10  CREATININE 0.77 0.79  CALCIUM 8.5* 8.6*   GFR Estimated Creatinine Clearance: 95.7 mL/min (by C-G formula based on SCr of 0.79 mg/dL). Liver Function Tests: Recent Labs  Lab 11/06/19 1042  AST 26  ALT 16  ALKPHOS 75  BILITOT 1.1  PROT 7.2  ALBUMIN 3.4*   No results for input(s): LIPASE, AMYLASE in the last 168 hours. No results for input(s): AMMONIA in the last 168 hours. Coagulation profile No results for input(s): INR, PROTIME in the last 168 hours.  CBC: Recent Labs  Lab 11/06/19 1042 11/07/19 0654  WBC 13.8* 8.4  HGB 13.3 13.0  HCT 38.9* 37.0*  MCV 91.1 88.5  PLT 340 325   Cardiac Enzymes: No results for input(s): CKTOTAL, CKMB, CKMBINDEX, TROPONINI in the last 168 hours. BNP (last 3 results) No results for input(s): PROBNP in the last 8760 hours. CBG: No results for input(s): GLUCAP in the last 168 hours. D-Dimer: No results for  input(s): DDIMER in the last 72 hours. Hgb A1c: No results for input(s): HGBA1C in the last 72 hours. Lipid Profile: No results for input(s): CHOL, HDL, LDLCALC, TRIG, CHOLHDL, LDLDIRECT in the last 72 hours. Thyroid function studies: No results for input(s): TSH, T4TOTAL, T3FREE, THYROIDAB in the last 72 hours.  Invalid input(s): FREET3 Anemia work up: No results  for input(s): VITAMINB12, FOLATE, FERRITIN, TIBC, IRON, RETICCTPCT in the last 72 hours. Sepsis Labs: Recent Labs  Lab 11/06/19 1042 11/06/19 1729 11/07/19 0654  PROCALCITON  --  0.12  --   WBC 13.8*  --  8.4    Microbiology Recent Results (from the past 240 hour(s))  Resp Panel by RT PCR (RSV, Flu A&B, Covid) - Nasopharyngeal Swab     Status: None   Collection Time: 11/06/19  5:29 PM   Specimen: Nasopharyngeal Swab  Result Value Ref Range Status   SARS Coronavirus 2 by RT PCR NEGATIVE NEGATIVE Final    Comment: (NOTE) SARS-CoV-2 target nucleic acids are NOT DETECTED.  The SARS-CoV-2 RNA is generally detectable in upper respiratoy specimens during the acute phase of infection. The lowest concentration of SARS-CoV-2 viral copies this assay can detect is 131 copies/mL. A negative result does not preclude SARS-Cov-2 infection and should not be used as the sole basis for treatment or other patient management decisions. A negative result may occur with  improper specimen collection/handling, submission of specimen other than nasopharyngeal swab, presence of viral mutation(s) within the areas targeted by this assay, and inadequate number of viral copies (<131 copies/mL). A negative result must be combined with clinical observations, patient history, and epidemiological information. The expected result is Negative.  Fact Sheet for Patients:  PinkCheek.be  Fact Sheet for Healthcare Providers:  GravelBags.it  This test is no t yet approved or cleared by the  [place] FDA and  has been authorized for detection and/or diagnosis of SARS-CoV-2 by FDA under an Emergency Use Authorization (EUA). This EUA will remain  in effect (meaning this test can be used) for the duration of the COVID-19 declaration under Section 564(b)(1) of the Act, 21 U.S.C. section 360bbb-3(b)(1), unless the authorization is terminated or revoked sooner.     Influenza A by PCR NEGATIVE NEGATIVE Final   Influenza B by PCR NEGATIVE NEGATIVE Final    Comment: (NOTE) The Xpert Xpress SARS-CoV-2/FLU/RSV assay is intended as an aid in  the diagnosis of influenza from Nasopharyngeal swab specimens and  should not be used as a sole basis for treatment. Nasal washings and  aspirates are unacceptable for Xpert Xpress SARS-CoV-2/FLU/RSV  testing.  Fact Sheet for Patients: PinkCheek.be  Fact Sheet for Healthcare Providers: GravelBags.it  This test is not yet approved or cleared by the [place] FDA and  has been authorized for detection and/or diagnosis of SARS-CoV-2 by  FDA under an Emergency Use Authorization (EUA). This EUA will remain  in effect (meaning this test can be used) for the duration of the  Covid-19 declaration under Section 564(b)(1) of the Act, 21  U.S.C. section 360bbb-3(b)(1), unless the authorization is  terminated or revoked.    Respiratory Syncytial Virus by PCR NEGATIVE NEGATIVE Final    Comment: (NOTE) Fact Sheet for Patients: PinkCheek.be  Fact Sheet for Healthcare Providers: GravelBags.it  This test is not yet approved or cleared by the [place] FDA and  has been authorized for detection and/or diagnosis of SARS-CoV-2 by  FDA under an Emergency Use Authorization (EUA). This EUA will remain  in effect (meaning this test can be used) for the duration of the  COVID-19 declaration under Section 564(b)(1) of the Act, 21  U.S.C.  section 360bbb-3(b)(1), unless the authorization is terminated or  revoked. Performed at [facility], [address], [place], [place] [address]     Procedures and diagnostic studies:  DG Chest 2 View  Result Date: 11/06/2019  CLINICAL DATA:  Hypoxia EXAM: CHEST - 2 VIEW COMPARISON:  January 18, 2018 FINDINGS: The cardiomediastinal silhouette is unchanged in contour.There are diffuse coarse interstitial opacities which have increased in comparison to prior in 2019. These are predominantly increased in the bases. Trace fluid in the RIGHT fissure. Biapical pleuroparenchymal scarring, unchanged. No pleural effusion. No pneumothorax. Visualized abdomen is unremarkable. Wedging of an upper upper lumbar vertebral body, unchanged. Atherosclerotic calcifications of the aorta. IMPRESSION: Diffuse coarse interstitial opacities, predominantly increased in the lung bases. Findings may reflect worsened underlying pulmonary fibrosis versus superimposed infection or edema on a background of pulmonary fibrosis. Electronically Signed   By: Valentino Saxon MD   On: 11/06/2019 16:55   CT Angio Chest PE W and/or Wo Contrast  Result Date: 11/06/2019 CLINICAL DATA:  Worsening shortness of breath EXAM: CT ANGIOGRAPHY CHEST WITH CONTRAST TECHNIQUE: Multidetector CT imaging of the chest was performed using the standard protocol during bolus administration of intravenous contrast. Multiplanar CT image reconstructions and MIPs were obtained to evaluate the vascular anatomy. CONTRAST:  43mL OMNIPAQUE IOHEXOL 350 MG/ML SOLN COMPARISON:  CT from 01/18/2018, chest x-ray from earlier in the same day. FINDINGS: Cardiovascular: Thoracic aorta demonstrates mild atherosclerotic calcifications without aneurysmal dilatation or dissection. No significant cardiac enlargement is seen. Coronary calcifications are noted. The pulmonary artery shows a normal branching pattern without evidence of pulmonary embolism.  Mediastinum/Nodes: Thoracic inlet is within normal limits. Scattered small mediastinal and hilar lymph nodes are noted increased from the prior exam but likely of a reactive nature given the changes in lung parenchyma. The esophagus as visualized is within normal limits. Lungs/Pleura: Chronic emphysematous and fibrotic changes are noted within the lungs bilaterally similar to that seen on prior CT examination. Additionally there are patchy predominately ground-glass opacities identified throughout both lung bases and to a lesser degree in the upper lobes bilaterally new from the prior exam consistent with an acute atypical pneumonia superimposed over the chronic fibrotic changes. No sizable effusion is seen. Previously seen Peri fissural nodularity in the left lower lobe is again identified but slightly less prominent consistent with a benign etiology likely related to scarring. No new sizable parenchymal nodules are seen. Upper Abdomen: Visualized upper abdomen demonstrates a small cyst within the liver stable from the prior exam. Mild adrenal thickening is again noted and stable. No acute abnormality in the upper abdomen is seen. Musculoskeletal: Degenerative changes of the thoracic spine are noted. Review of the MIP images confirms the above findings. IMPRESSION: Progressive acute on chronic infiltrate in the lower lobes bilaterally consistent with atypical pneumonia. No evidence of pulmonary emboli. Persistent chronic fibrotic changes similar to that seen on the prior exam. Aortic Atherosclerosis (ICD10-I70.0) and Emphysema (ICD10-J43.9). Electronically Signed   By: Inez Catalina M.D.   On: 11/06/2019 19:24   ECHOCARDIOGRAM COMPLETE  Result Date: 11/08/2019    ECHOCARDIOGRAM REPORT   Patient Name:   BREK REECE Kim Date of Exam: 11/07/2019 Medical Rec #:  [id number]          Height:       69.0 in Accession #:    [id number]         Weight:       160.0 lb Date of Birth:  12-Aug-1955          BSA:          1.879  m Patient Age:    59 years           BP:  138/89 mmHg Patient Gender: M                  HR:           99 bpm. Exam Location:  [facility] Procedure: 2D Echo Indications:     DYSPNEA 786.09/R06.00  History:         Patient has no prior history of Echocardiogram examinations.                  Risk Factors:Hypertension.  Sonographer:     Avanell Shackleton Referring Phys:  [id number] Cleaster Corin PATEL Diagnosing Phys: Yolonda Kida MD IMPRESSIONS  1. Bordrline inferior hypo.  2. Left ventricular ejection fraction, by estimation, is 60 to 65%. The left ventricle has normal function. The left ventricle demonstrates regional wall motion abnormalities (see scoring diagram/findings for description). Left ventricular diastolic parameters were normal.  3. Right ventricular systolic function is normal. The right ventricular size is normal. There is normal pulmonary artery systolic pressure.  4. The mitral valve is normal in structure. Trivial mitral valve regurgitation.  5. The aortic valve is normal in structure. Aortic valve regurgitation is not visualized. FINDINGS  Left Ventricle: Left ventricular ejection fraction, by estimation, is 60 to 65%. The left ventricle has normal function. The left ventricle demonstrates regional wall motion abnormalities. The left ventricular internal cavity size was normal in size. There is no left ventricular hypertrophy. Left ventricular diastolic parameters were normal.  LV Wall Scoring: The basal inferolateral segment and basal inferior segment are hypokinetic. Right Ventricle: The right ventricular size is normal. No increase in right ventricular wall thickness. Right ventricular systolic function is normal. There is normal pulmonary artery systolic pressure. Left Atrium: Left atrial size was normal in size. Right Atrium: Right atrial size was normal in size. Pericardium: There is no evidence of pericardial effusion. Mitral Valve: The mitral valve is normal in structure. Trivial mitral  valve regurgitation. Tricuspid Valve: The tricuspid valve is normal in structure. Tricuspid valve regurgitation is trivial. Aortic Valve: The aortic valve is normal in structure. Aortic valve regurgitation is not visualized. Pulmonic Valve: The pulmonic valve was grossly normal. Pulmonic valve regurgitation is not visualized. Aorta: The aortic root is normal in size and structure. IAS/Shunts: No atrial level shunt detected by color flow Doppler. Additional Comments: Bordrline inferior hypo.  LEFT VENTRICLE PLAX 2D LVIDd:         4.57 cm  Diastology LVIDs:         2.82 cm  LV e' medial:   9.03 cm/s LV PW:         0.76 cm  LV E/e' medial: 9.8 LV IVS:        0.83 cm LVOT diam:     1.80 cm LVOT Area:     2.54 cm  RIGHT VENTRICLE             IVC RV S prime:     14.80 cm/s  IVC diam: 1.85 cm TAPSE (M-mode): 3.4 cm LEFT ATRIUM           Index       RIGHT ATRIUM           Index LA diam:      4.00 cm 2.13 cm/m  RA Area:     18.10 cm LA Vol (A4C): 50.8 ml 27.03 ml/m RA Volume:   49.40 ml  26.29 ml/m   AORTA Ao Root diam: 3.50 cm MITRAL VALVE MV Area (PHT): 4.06 cm  SHUNTS MV Decel Time: 187 msec    Systemic Diam: 1.80 cm MV E velocity: 88.80 cm/s MV A velocity: 86.30 cm/s MV E/A ratio:  1.03 Dwayne D Callwood MD Electronically signed by Yolonda Kida MD Signature Date/Time: 11/08/2019/7:53:52 AM    Final                LOS: 1 day   Zoanne Newill  [facility]   Pager on [email or web address]. If 7PM-7AM, please contact night-coverage at [email or web address]     11/08/2019, 10:15 AM

## 2019-11-08 NOTE — Progress Notes (Signed)
Mobility Specialist - Progress Note   11/08/19 1651  Mobility  Activity Ambulated in room;Ambulated in hall  Level of Assistance Modified independent, requires aide device or extra time (CGA for safety)  Assistive Device None  Distance Ambulated (ft) 310 ft  Mobility Response Tolerated well  Mobility performed by Mobility specialist  $Mobility charge 1 Mobility    Pre-mobility: 90 HR, 97% SpO2 During mobility: 122 HR, 88% SpO2 Post-mobility: 115 HR, 93% SpO2   Pt laying in bed w/ family member present in room upon arrival. Pt agreed to session. Pt currently utilizing 2L O2 [place]. Pt mod. Independent w/ bed mobility. Pt ambulated 310' total in room and hallway mod. Independently w/o AD. CGA utilized for safety. Initially planned to ambulate more, however, pt's mobility limited by SOB and O2 levels. Pt desat to 88% during ambulation session. Pt able to manage O2 > 87% by PLB. Once returning to bedside, pt's O2 sat up to 93%. Pt was motivated to do more. Pt performed standing marches (20 reps total) at bedside w/ SBA. Pt tolerated exercise well, however, O2 desat to 89%. Overall, pt tolerated session very well. Pt pleasant and very motivated t/o session. Pt's O2 sat up to 95% seconds after completing activity. Pt states his O2 "goes up quick" once he rests from mobilizing. Pt left laying in bed w/ family member present in room. Call bell and phone placed in reach. Nurse was notified.    Austin Kim Mobility Specialist  11/08/19, 5:06 PM

## 2019-11-09 DIAGNOSIS — E440 Moderate protein-calorie malnutrition: Secondary | ICD-10-CM | POA: Insufficient documentation

## 2019-11-09 MED ORDER — IPRATROPIUM-ALBUTEROL 0.5-2.5 (3) MG/3ML IN SOLN
3.0000 mL | Freq: Four times a day (QID) | RESPIRATORY_TRACT | Status: DC
  Administered 2019-11-09 – 2019-11-10 (×4): 3 mL via RESPIRATORY_TRACT
  Filled 2019-11-09 (×4): qty 3

## 2019-11-09 MED ORDER — BENZONATATE 100 MG PO CAPS
200.0000 mg | ORAL_CAPSULE | Freq: Three times a day (TID) | ORAL | Status: DC
  Administered 2019-11-09 – 2019-11-13 (×13): 200 mg via ORAL
  Filled 2019-11-09 (×14): qty 2

## 2019-11-09 MED ORDER — BUDESONIDE 0.25 MG/2ML IN SUSP
0.2500 mg | Freq: Two times a day (BID) | RESPIRATORY_TRACT | Status: DC
  Administered 2019-11-09 – 2019-11-10 (×2): 0.25 mg via RESPIRATORY_TRACT
  Filled 2019-11-09 (×2): qty 2

## 2019-11-09 MED ORDER — ARFORMOTEROL TARTRATE 15 MCG/2ML IN NEBU
15.0000 ug | INHALATION_SOLUTION | Freq: Two times a day (BID) | RESPIRATORY_TRACT | Status: DC
  Administered 2019-11-09 – 2019-11-10 (×3): 15 ug via RESPIRATORY_TRACT
  Filled 2019-11-09 (×3): qty 2

## 2019-11-09 MED ORDER — CEFDINIR 300 MG PO CAPS
300.0000 mg | ORAL_CAPSULE | Freq: Two times a day (BID) | ORAL | Status: AC
  Administered 2019-11-09 – 2019-11-10 (×4): 300 mg via ORAL
  Filled 2019-11-09 (×4): qty 1

## 2019-11-09 MED ORDER — ENSURE ENLIVE PO LIQD
237.0000 mL | Freq: Two times a day (BID) | ORAL | Status: DC
  Administered 2019-11-09 – 2019-11-12 (×4): 237 mL via ORAL

## 2019-11-09 NOTE — Progress Notes (Signed)
Initial Nutrition Assessment  DOCUMENTATION CODES:   Non-severe (moderate) malnutrition in context of chronic illness  INTERVENTION:   Ensure Enlive po BID, each supplement provides 350 kcal and 20 grams of protein  Liberalize diet   NUTRITION DIAGNOSIS:   Moderate Malnutrition related to chronic illness (COPD, ILD) as evidenced by mild fat depletions, mild to moderate muscle depletions.  GOAL:   Patient will meet greater than or equal to 90% of their needs  MONITOR:   PO intake, Supplement acceptance, Labs, Weight trends, Skin, I & O's  REASON FOR ASSESSMENT:   Consult Assessment of nutrition requirement/status  ASSESSMENT:   64 y.o. male with medical history significant for emphysema not requiring home O2, remote history of spontaneous pneumothorax requiring pleurodesis, hypertension, and hyperlipidemia who is admitted with PNA   Met with pt in room today. Pt reports good appetite and oral intake pta and in hospital. Pt reports that he is eating 100% of meals in hospital; pt reports eating protein with all of his meals. Pt reports that he has drank supplements in the past but does not drink them regularly. RD discussed with pt the importance of adequate nutrition needed to preserve lean muscle. Pt is agreeable to Ensure supplements while in hospital. RD will add supplements and liberalize pt's diet to help him meet his estimated needs. Per chart, pt appears weight stable at baseline. Pt reports his UBW is around 160lbs.   Medications reviewed and include: omnicef, lovenox, solu-medrol  Labs reviewed: Na 133(L)  NUTRITION - FOCUSED PHYSICAL EXAM:    Most Recent Value  Orbital Region Mild depletion  Upper Arm Region Mild depletion  Thoracic and Lumbar Region Unable to assess  Buccal Region Mild depletion  Temple Region Mild depletion  Clavicle Bone Region Mild depletion  Clavicle and Acromion Bone Region Mild depletion  Scapular Bone Region Mild depletion  Dorsal  Hand Mild depletion  Patellar Region Mild depletion  Anterior Thigh Region Moderate depletion  Posterior Calf Region Moderate depletion  Edema (RD Assessment) None  Hair Reviewed  Eyes Reviewed  Mouth Reviewed  Skin Reviewed  Nails Reviewed     Diet Order:   Diet Order            Diet regular Room service appropriate? Yes; Fluid consistency: Thin  Diet effective now                EDUCATION NEEDS:   Education needs have been addressed  Skin:  Skin Assessment: Reviewed RN Assessment  Last BM:  9/21  Height:   Ht Readings from Last 1 Encounters:  11/06/19 5' 9" (1.753 m)    Weight:   Wt Readings from Last 1 Encounters:  11/06/19 72.6 kg    Ideal Body Weight:  72.7 kg  BMI:  Body mass index is 23.63 kg/m.  Estimated Nutritional Needs:   Kcal:  2000-2300kcal/day  Protein:  100-115g/day  Fluid:  2.2-2.5L/day  Koleen Distance MS, RD, LDN Please refer to [facility] for RD and/or RD on-call/weekend/after hours pager

## 2019-11-09 NOTE — Progress Notes (Addendum)
SATURATION QUALIFICATIONS: (This note is used to comply with regulatory documentation for home oxygen)  Patient Saturations on Room Air at Rest = 94%  Patient Saturations on Room Air while Ambulating = 83%  Patient Saturations on 4Liters of oxygen while Ambulating = 87 %  Please briefly explain why patient needs home oxygen:

## 2019-11-09 NOTE — Progress Notes (Signed)
Mobility Specialist - Progress Note   11/09/19 1439  Mobility  Activity Ambulated in hall  Level of Assistance Modified independent, requires aide device or extra time  Assistive Device None (Pt pushed telemetry pole)  Distance Ambulated (ft) 540 ft  Mobility Response Tolerated well  Mobility performed by Mobility specialist  $Mobility charge 1 Mobility    Pre-mobility: 99 HR, 161/81 BP, 97% SpO2 During mobility: 116 HR, 87% SpO2 Post-mobility: 89 HR, 92% SpO2    Pt was lying in bed upon arrival with family present in room. Pt agreed to session. Pt was very pleasant, but did seem very discouraged this session as he stated "I just don't see me getting any better." RN entered room and assisted the entire session. Pt was able to get EOB and perform STS with no physical assistance. Pt ambulated 3x around nurses station with no noted LOB. RN kept track of saturation levels throughout session. VC were needed to maintain a slowed pace. Pt initially started ambulation on room air, but after ~100', RN increased [place] O2 to 2-3L as pt desat to 83%. Pt was able to utilize PLB technique to increase saturation levels slightly, however, nurse increased O2 to 4L where pt sat up to 87%. Upon arriving to room, OT entered. Pt sat in chair, still utilizing PLB technique as O2 sats increased to low-mid 90s. Overall, pt tolerated session well. Pt was left in chair on 4L [place] for therapy session.    Kathee Delton Mobility Specialist 11/09/19, 2:58 PM

## 2019-11-09 NOTE — Clinical Social Work Note (Signed)
CSW sent secure email to [place] representative to notify of home oxygen need.  Dayton Scrape, [place]

## 2019-11-09 NOTE — Progress Notes (Signed)
PT Cancellation Note  Patient Details Name: Austin Kim MRN: [id number] DOB: 16-Sep-1955   Cancelled Treatment:    Reason Eval/Treat Not Completed: Other (comment). Spoke with MD who reports no change in status today. No formal need for acute PT evaluation at this time. Has been mobilizing with mobility specialist independently with vitals monitored. Will dc current order at this time. Please re-order if needs change.   Evelene Roussin 11/09/2019, 1:53 PM Greggory Stallion, PT, DPT [phone]

## 2019-11-09 NOTE — Progress Notes (Signed)
PROGRESS NOTE    Austin Kim Kim  FUX:[id number] DOB: 08/22/1955 DOA: 11/06/2019 PCP: Juluis Pitch, MD  Brief Narrative:  Austin Kim is a 64 y.o. male  with medical history significant foremphysema not requiring home O2, former smoker (quit smoking cigarettes in 2002), remote history of spontaneous pneumothorax requiring pleurodesis,hypertension, and hyperlipidemia.  He presented to the hospital because of shortness of breath that is worse with exertion, nonproductive cough, generalized weakness and fatigue. His oxygen saturation was 88% on room air and went down to 81% with ambulation.  CTA chest showed persistent chronic fibrotic changes and progressive acute on chronic infiltration bilateral lower lobes suggestive of atypical pneumonia.  He was admitted to the hospital for pneumonia, COPD exacerbation and acute hypoxemic respiratory failure.  He was treated with IV antibiotics, steroids and bronchodilators.  Patient clinically responding to treatment however still experiencing a severe cough and fairly marked desaturation with exertion.  Is able to ambulate without too much difficulty but does desaturate.   Assessment & Plan:   Principal Problem:   ILD (interstitial lung disease) ([place]) Active Problems:   Hypertension   Acute respiratory failure with hypoxia (HCC)   Hyponatremia   Atypical pneumonia   COPD with acute exacerbation (HCC)   Malnutrition of moderate degree  Acute hypoxemic respiratory failure COPD with acute exacerbation Suspected interstitial lung disease Possible atypical pneumonia Patient has had known COPD for several years He was previously established with Dr. Raul Del from [facility] clinic pulmonology He has not seen him in several years as his respiratory status was stable Suspect this presentation represents an exacerbation of underlying ILD with concomitant COPD Plan: Continue oxygen, wean as tolerated Home oxygen ordered Add scheduled  DuoNebs every 6 hours Scheduled Brovana twice daily Scheduled Pulmicort twice daily Continue steroids, Solu-Medrol 40 mg IV every 12 hours Hycodan for cough suppression Scheduled Tessalon Perles Empiric antibiotics We will refer back to Dr. Gust Brooms office at time of discharge  Hypertension Currently reasonably well controlled Continue current antihypertensives  Hyponatremia SIADH versus volume depletion Mild Daily BMP   DVT prophylaxis: Lovenox Code Status: Full Family Communication: Left voicemail for wife Graylen Noboa [phone] on 11/09/2019 Disposition Plan: Status is: Inpatient  Remains inpatient appropriate because:Inpatient level of care appropriate due to severity of illness   Dispo: The patient is from: Home              Anticipated d/c is to: Home              Anticipated d/c date is: 1 day              Patient currently is not medically stable to d/c.  Remains hypoxic requiring up to 4 L at time of ambulation.  Will expand nebulizer regimen and plan for additional 24 hours of inpatient treatment and monitoring prior to disposition planning.  Anticipate discharge home on 11/10/2019.   Consultants:   None  Procedures:   None  Antimicrobials:  Azithromycin  Ceftriaxone   Subjective: Seen and examined.  Endorsing cough  Objective: Vitals:   11/08/19 1625 11/08/19 1952 11/09/19 0512 11/09/19 1119  BP: 136/86 (!) 152/73 136/79 139/84  Pulse: 93 76 77 76  Resp: 20 20 20 20   Temp: 98.2 F (36.8 C) 98.7 F (37.1 C) 98.1 F (36.7 C) 98.3 F (36.8 C)  TempSrc: Oral Oral Oral Oral  SpO2: 100% 99% 97% 96%  Weight:      Height:        Intake/Output  Summary (Last 24 hours) at 11/09/2019 1443 Last data filed at 11/09/2019 1001 Gross per 24 hour  Intake 840 ml  Output 1700 ml  Net -860 ml   Filed Weights   11/06/19 1038  Weight: 72.6 kg    Examination:  General exam: Appears calm and comfortable  Respiratory system: Diffuse scattered  crackles bilaterally.  Quality air entry.  2 L nasal cannula.  Normal work of breathing  cardiovascular system: S1 & S2 heard, RRR. No JVD, murmurs, rubs, gallops or clicks. No pedal edema. Gastrointestinal system: Abdomen is nondistended, soft and nontender. No organomegaly or masses felt. Normal bowel sounds heard. Central nervous system: Alert and oriented. No focal neurological deficits. Extremities: Symmetric 5 x 5 power. Skin: No rashes, lesions or ulcers Psychiatry: Judgement and insight appear normal. Mood & affect appropriate.     Data Reviewed: I have personally reviewed following labs and imaging studies  CBC: Recent Labs  Lab 11/06/19 1042 11/07/19 0654  WBC 13.8* 8.4  HGB 13.3 13.0  HCT 38.9* 37.0*  MCV 91.1 88.5  PLT 340 532   Basic Metabolic Panel: Recent Labs  Lab 11/06/19 1042 11/07/19 0654 11/08/19 1025  NA 128* 133* 133*  K 4.1 4.5 4.3  CL 94* 98 96*  CO2 24 25 28   GLUCOSE 136* 146* 147*  BUN 8 10 17   CREATININE 0.77 0.79 0.74  CALCIUM 8.5* 8.6* 8.8*   GFR: Estimated Creatinine Clearance: 95.7 mL/min (by C-G formula based on SCr of 0.74 mg/dL). Liver Function Tests: Recent Labs  Lab 11/06/19 1042  AST 26  ALT 16  ALKPHOS 75  BILITOT 1.1  PROT 7.2  ALBUMIN 3.4*   No results for input(s): LIPASE, AMYLASE in the last 168 hours. No results for input(s): AMMONIA in the last 168 hours. Coagulation Profile: No results for input(s): INR, PROTIME in the last 168 hours. Cardiac Enzymes: No results for input(s): CKTOTAL, CKMB, CKMBINDEX, TROPONINI in the last 168 hours. BNP (last 3 results) No results for input(s): PROBNP in the last 8760 hours. HbA1C: No results for input(s): HGBA1C in the last 72 hours. CBG: No results for input(s): GLUCAP in the last 168 hours. Lipid Profile: No results for input(s): CHOL, HDL, LDLCALC, TRIG, CHOLHDL, LDLDIRECT in the last 72 hours. Thyroid Function Tests: No results for input(s): TSH, T4TOTAL, FREET4,  T3FREE, THYROIDAB in the last 72 hours. Anemia Panel: No results for input(s): VITAMINB12, FOLATE, FERRITIN, TIBC, IRON, RETICCTPCT in the last 72 hours. Sepsis Labs: Recent Labs  Lab 11/06/19 1729  PROCALCITON 0.12    Recent Results (from the past 240 hour(s))  Resp Panel by RT PCR (RSV, Flu A&B, Covid) - Nasopharyngeal Swab     Status: None   Collection Time: 11/06/19  5:29 PM   Specimen: Nasopharyngeal Swab  Result Value Ref Range Status   SARS Coronavirus 2 by RT PCR NEGATIVE NEGATIVE Final    Comment: (NOTE) SARS-CoV-2 target nucleic acids are NOT DETECTED.  The SARS-CoV-2 RNA is generally detectable in upper respiratoy specimens during the acute phase of infection. The lowest concentration of SARS-CoV-2 viral copies this assay can detect is 131 copies/mL. A negative result does not preclude SARS-Cov-2 infection and should not be used as the sole basis for treatment or other patient management decisions. A negative result may occur with  improper specimen collection/handling, submission of specimen other than nasopharyngeal swab, presence of viral mutation(s) within the areas targeted by this assay, and inadequate number of viral copies (<131 copies/mL). A negative result  must be combined with clinical observations, patient history, and epidemiological information. The expected result is Negative.  Fact Sheet for Patients:  PinkCheek.be  Fact Sheet for Healthcare Providers:  GravelBags.it  This test is no t yet approved or cleared by the [place] FDA and  has been authorized for detection and/or diagnosis of SARS-CoV-2 by FDA under an Emergency Use Authorization (EUA). This EUA will remain  in effect (meaning this test can be used) for the duration of the COVID-19 declaration under Section 564(b)(1) of the Act, 21 U.S.C. section 360bbb-3(b)(1), unless the authorization is terminated or revoked sooner.       Influenza A by PCR NEGATIVE NEGATIVE Final   Influenza B by PCR NEGATIVE NEGATIVE Final    Comment: (NOTE) The Xpert Xpress SARS-CoV-2/FLU/RSV assay is intended as an aid in  the diagnosis of influenza from Nasopharyngeal swab specimens and  should not be used as a sole basis for treatment. Nasal washings and  aspirates are unacceptable for Xpert Xpress SARS-CoV-2/FLU/RSV  testing.  Fact Sheet for Patients: PinkCheek.be  Fact Sheet for Healthcare Providers: GravelBags.it  This test is not yet approved or cleared by the [place] FDA and  has been authorized for detection and/or diagnosis of SARS-CoV-2 by  FDA under an Emergency Use Authorization (EUA). This EUA will remain  in effect (meaning this test can be used) for the duration of the  Covid-19 declaration under Section 564(b)(1) of the Act, 21  U.S.C. section 360bbb-3(b)(1), unless the authorization is  terminated or revoked.    Respiratory Syncytial Virus by PCR NEGATIVE NEGATIVE Final    Comment: (NOTE) Fact Sheet for Patients: PinkCheek.be  Fact Sheet for Healthcare Providers: GravelBags.it  This test is not yet approved or cleared by the [place] FDA and  has been authorized for detection and/or diagnosis of SARS-CoV-2 by  FDA under an Emergency Use Authorization (EUA). This EUA will remain  in effect (meaning this test can be used) for the duration of the  COVID-19 declaration under Section 564(b)(1) of the Act, 21 U.S.C.  section 360bbb-3(b)(1), unless the authorization is terminated or  revoked. Performed at [facility], [address] [place], [place] [address]          Radiology Studies: ECHOCARDIOGRAM COMPLETE  Result Date: 11/08/2019    ECHOCARDIOGRAM REPORT   Patient Name:   LAVI SHEEHAN Kim Date of Exam: 11/07/2019 Medical Rec #:  [id number]          Height:        69.0 in Accession #:    [id number]         Weight:       160.0 lb Date of Birth:  Mar 07, 1955          BSA:          1.879 m Patient Age:    55 years           BP:           138/89 mmHg Patient Gender: M                  HR:           99 bpm. Exam Location:  [facility] Procedure: 2D Echo Indications:     DYSPNEA 786.09/R06.00  History:         Patient has no prior history of Echocardiogram examinations.                  Risk Factors:Hypertension.  Sonographer:     Avanell Shackleton Referring Phys:  [id number] Cleaster Corin PATEL Diagnosing Phys: Yolonda Kida MD IMPRESSIONS  1. Bordrline inferior hypo.  2. Left ventricular ejection fraction, by estimation, is 60 to 65%. The left ventricle has normal function. The left ventricle demonstrates regional wall motion abnormalities (see scoring diagram/findings for description). Left ventricular diastolic parameters were normal.  3. Right ventricular systolic function is normal. The right ventricular size is normal. There is normal pulmonary artery systolic pressure.  4. The mitral valve is normal in structure. Trivial mitral valve regurgitation.  5. The aortic valve is normal in structure. Aortic valve regurgitation is not visualized. FINDINGS  Left Ventricle: Left ventricular ejection fraction, by estimation, is 60 to 65%. The left ventricle has normal function. The left ventricle demonstrates regional wall motion abnormalities. The left ventricular internal cavity size was normal in size. There is no left ventricular hypertrophy. Left ventricular diastolic parameters were normal.  LV Wall Scoring: The basal inferolateral segment and basal inferior segment are hypokinetic. Right Ventricle: The right ventricular size is normal. No increase in right ventricular wall thickness. Right ventricular systolic function is normal. There is normal pulmonary artery systolic pressure. Left Atrium: Left atrial size was normal in size. Right Atrium: Right atrial size was normal in size. Pericardium:  There is no evidence of pericardial effusion. Mitral Valve: The mitral valve is normal in structure. Trivial mitral valve regurgitation. Tricuspid Valve: The tricuspid valve is normal in structure. Tricuspid valve regurgitation is trivial. Aortic Valve: The aortic valve is normal in structure. Aortic valve regurgitation is not visualized. Pulmonic Valve: The pulmonic valve was grossly normal. Pulmonic valve regurgitation is not visualized. Aorta: The aortic root is normal in size and structure. IAS/Shunts: No atrial level shunt detected by color flow Doppler. Additional Comments: Bordrline inferior hypo.  LEFT VENTRICLE PLAX 2D LVIDd:         4.57 cm  Diastology LVIDs:         2.82 cm  LV e' medial:   9.03 cm/s LV PW:         0.76 cm  LV E/e' medial: 9.8 LV IVS:        0.83 cm LVOT diam:     1.80 cm LVOT Area:     2.54 cm  RIGHT VENTRICLE             IVC RV S prime:     14.80 cm/s  IVC diam: 1.85 cm TAPSE (M-mode): 3.4 cm LEFT ATRIUM           Index       RIGHT ATRIUM           Index LA diam:      4.00 cm 2.13 cm/m  RA Area:     18.10 cm LA Vol (A4C): 50.8 ml 27.03 ml/m RA Volume:   49.40 ml  26.29 ml/m   AORTA Ao Root diam: 3.50 cm MITRAL VALVE MV Area (PHT): 4.06 cm    SHUNTS MV Decel Time: 187 msec    Systemic Diam: 1.80 cm MV E velocity: 88.80 cm/s MV A velocity: 86.30 cm/s MV E/A ratio:  1.03 Dwayne D Callwood MD Electronically signed by Yolonda Kida MD Signature Date/Time: 11/08/2019/7:53:52 AM    Final         Scheduled Meds: . amLODipine  5 mg Oral 1 day or 1 dose  . arformoterol  15 mcg Nebulization BID  . benzonatate  200 mg Oral TID  . budesonide (  PULMICORT) nebulizer solution  0.25 mg Nebulization BID  . cefdinir  300 mg Oral Q12H  . enoxaparin (LOVENOX) injection  40 mg Subcutaneous Q24H  . feeding supplement (ENSURE ENLIVE)  237 mL Oral BID BM  . ipratropium-albuterol  3 mL Nebulization Q6H  . methylPREDNISolone (SOLU-MEDROL) injection  40 mg Intravenous Q12H  . metoprolol  tartrate  50 mg Oral BID   Continuous Infusions:   LOS: 2 days    Time spent: 25 minutes    Sidney Ace, MD [facility] Pager [phone]  If 7PM-7AM, please contact night-coverage 11/09/2019, 2:43 PM

## 2019-11-09 NOTE — Progress Notes (Signed)
RT to patient bedside for scheduled breathing treatment. Patient states he is very discouraged with his prognosis, stating he was a very active person and now he is going home on oxygen, fearing he will never be back to his "normal" self. Patient on room air at rest and [place] while ambulating. Clear/diminished bilateral breath sounds. Patient encouraged to use Incentive while here and at home, RT provided additional instruction and education on incentive and worked with patient on IS. Patient able to pull approximately 1250 with encouragement and coaching. Provided patient with additional information on nebulizers as well. Will continue to monitor and encourage patient to use IS.

## 2019-11-09 NOTE — Evaluation (Signed)
Occupational Therapy Evaluation Patient Details Name: Austin Kim MRN: [id number] DOB: 1955-03-07 Today's Date: 11/09/2019    History of Present Illness 64 y.o. male  with medical history significant for emphysema not requiring home O2,  former smoker (quit smoking cigarettes in 2002), remote history of spontaneous pneumothorax requiring pleurodesis, hypertension, and hyperlipidemia.  He presented to the hospital because of shortness of breath that is worse with exertion, nonproductive cough, generalized weakness and fatigue.His oxygen saturation was 88% on room air and went down to 81% with ambulation.  CTA chest showed persistent chronic fibrotic changes and progressive acute on chronic infiltration bilateral lower lobes suggestive of atypical pneumonia.   Clinical Impression   Patient presenting with deceased I in self care, balance, functional mobility/transfers, endurance, safety awareness, and strength. Patient reports being independent PTA and lives with wife. He is very active prior to admission. Patient currently functioning at supervision level overall with functional transfers and self care tasks without use of AD. Pt engaged in functional tasks on 4 L O2 via [place] and needing min cuing for pursed lip breathing to recover from mid 80's desaturation. OT educated pt and wife on need to use shower chair to conserve energy and how to wear oxygen while showering with need of ventilate room and lukewarm water. Pt would benefit from energy conservation education. Patient will benefit from acute OT to increase overall independence in the areas of ADLs, functional mobility, and safety awareness in order to safely discharge home with caregiver.    Follow Up Recommendations  Home health OT;Supervision - Intermittent    Equipment Recommendations  Tub/shower seat    Recommendations for Other Services Other (comment) (none at this time)     Precautions / Restrictions Precautions Precautions:  Fall      Mobility Bed Mobility      General bed mobility comments: Pt seated in chair in room  Transfers Overall transfer level: Needs assistance Equipment used: None Transfers: Sit to/from Stand;Stand Pivot Transfers Sit to Stand: Supervision Stand pivot transfers: Supervision       General transfer comment: for safety and management of lines    Balance Overall balance assessment: Needs assistance Sitting-balance support: Feet supported Sitting balance-Leahy Scale: Normal     Standing balance support: During functional activity Standing balance-Leahy Scale: Good          ADL either performed or assessed with clinical judgement   ADL Overall ADL's : Needs assistance/impaired      General ADL Comments: supervision overall for all spects of care     Vision Baseline Vision/History: No visual deficits;Wears glasses Wears Glasses: At all times Patient Visual Report: No change from baseline              Pertinent Vitals/Pain Pain Assessment: No/denies pain     Hand Dominance Right   Extremity/Trunk Assessment Upper Extremity Assessment Upper Extremity Assessment: Overall WFL for tasks assessed   Lower Extremity Assessment Lower Extremity Assessment: Overall WFL for tasks assessed   Cervical / Trunk Assessment Cervical / Trunk Assessment: Normal   Communication Communication Communication: No difficulties   Cognition Arousal/Alertness: Awake/alert Behavior During Therapy: WFL for tasks assessed/performed Overall Cognitive Status: Within Functional Limits for tasks assessed                     Home Living Family/patient expects to be discharged to:: Private residence Living Arrangements: Spouse/significant other Available Help at Discharge: Family;Available 24 hours/day Type of Home: House Home Access: Stairs to  enter Entrance Stairs-Number of Steps: 2-3 Entrance Stairs-Rails: Right;Left Home Layout: One level     Bathroom Shower/Tub:  Occupational psychologist: Standard Bathroom Accessibility: Yes How Accessible: Accessible via walker Home Equipment: None          Prior Functioning/Environment Level of Independence: Independent        Comments: Pt independent in all aspects of care and very active        OT Problem List: Cardiopulmonary status limiting activity;Decreased safety awareness;Decreased activity tolerance;Impaired balance (sitting and/or standing);Decreased knowledge of use of DME or AE      OT Treatment/Interventions: Self-care/ADL training;Therapeutic exercise;Therapeutic activities;Energy conservation;Patient/family education;Balance training    OT Goals(Current goals can be found in the care plan section) Acute Rehab OT Goals Patient Stated Goal: to return home OT Goal Formulation: With patient Time For Goal Achievement: 11/23/19 Potential to Achieve Goals: Good ADL Goals Pt Will Transfer to Toilet: Independently;ambulating Pt Will Perform Toileting - Clothing Manipulation and hygiene: Independently;sit to/from stand Pt Will Perform Tub/Shower Transfer: with supervision;ambulating;shower seat Additional ADL Goal #1: Pt will verbalize 3 energy conservation strategies for self care tasks.  OT Frequency: Min 1X/week   Barriers to D/C: Other (comment)  none at this time          AM-PAC OT "6 Clicks" Daily Activity     Outcome Measure Help from another person eating meals?: None Help from another person taking care of personal grooming?: None Help from another person toileting, which includes using toliet, bedpan, or urinal?: None Help from another person bathing (including washing, rinsing, drying)?: None Help from another person to put on and taking off regular upper body clothing?: None Help from another person to put on and taking off regular lower body clothing?: None 6 Click Score: 24   End of Session Nurse Communication: Mobility status  Activity Tolerance: Patient  tolerated treatment well Patient left: with call bell/phone within reach;with family/visitor present  OT Visit Diagnosis: Other (comment) (cardiopulmonary status)                Time: 0459-9774 OT Time Calculation (min): 20 min Charges:  OT General Charges $OT Visit: 1 Visit OT Evaluation $OT Eval Low Complexity: 1 Low OT Treatments $Self Care/Home Management : 8-22 mins   Darleen Crocker, MS, OTR/L , CBIS ascom [phone]  11/09/19, 2:22 PM  11/09/2019, 2:22 PM

## 2019-11-10 LAB — RESPIRATORY PANEL BY PCR

## 2019-11-10 LAB — HIV ANTIBODY (ROUTINE TESTING W REFLEX): HIV Screen 4th Generation wRfx: NONREACTIVE

## 2019-11-10 MED ORDER — IPRATROPIUM-ALBUTEROL 0.5-2.5 (3) MG/3ML IN SOLN
3.0000 mL | Freq: Four times a day (QID) | RESPIRATORY_TRACT | Status: DC | PRN
  Administered 2019-11-10 – 2019-11-11 (×3): 3 mL via RESPIRATORY_TRACT
  Filled 2019-11-10 (×3): qty 3

## 2019-11-10 MED ORDER — FUROSEMIDE 10 MG/ML IJ SOLN
40.0000 mg | Freq: Two times a day (BID) | INTRAMUSCULAR | Status: DC
  Administered 2019-11-10: 40 mg via INTRAVENOUS
  Filled 2019-11-10: qty 4

## 2019-11-10 NOTE — Progress Notes (Signed)
Mobility Specialist - Progress Note   11/10/19 1300  Mobility  Activity Ambulated in hall  Level of Assistance Modified independent, requires aide device or extra time  Assistive Device None  Distance Ambulated (ft) 380 ft  Mobility Response Tolerated well  Mobility performed by Mobility specialist  $Mobility charge 1 Mobility    Pre-mobility: 91 HR, 147/80 BP, 92% SpO2 During mobility: 107 HR, 88% SpO2 Post-mobility: 96 HR, 92%  SpO2   Pt was lying in bed utilizing room air upon arrival with wife present in room. Pt agreed to session. Pt was modI in STS transfer and while ambulating without AD. With approval from nurse, O2 tank was set on 4L for session. Pt was able to ambulate 1 full lap around nursing station before O2 desat to 88%. Pt utilized PLB throughout session, and denied SOB during ambulation. Pt ambulated an additional lap around nurses station, still no LOB noted as pt continued denying SOB. Upon returning to room pt then c/o SOB, and [place] was increased to 5L for recovery. After 2 mins, O2 sat up to low-mid 90s as [place] was set back to 4L. Pt wanted to remain on O2 until after lunch as he stated "it drops as I'm eating." Overall, pt tolerated session well. Pt was left in bed with all needs in reach. Nurse was notified of performance.    Kathee Delton Mobility Specialist 11/10/19, 1:58 PM

## 2019-11-10 NOTE — Progress Notes (Signed)
   11/10/19 1019  Assess: MEWS Score  Temp 98.3 F (36.8 C)  BP 132/78  Pulse Rate (!) 115  Resp 19  SpO2 95 %  O2 Device Room Air  Assess: MEWS Score  MEWS Temp 0  MEWS Systolic 0  MEWS Pulse 2  MEWS RR 0  MEWS LOC 0  MEWS Score 2  MEWS Score Color Yellow  Assess: if the MEWS score is Yellow or Red  Were vital signs taken at a resting state? Yes  Focused Assessment Change from prior assessment (see assessment flowsheet)  Early Detection of Sepsis Score *See Row Information* Low  MEWS guidelines implemented *See Row Information* Yes  Treat  MEWS Interventions Administered scheduled meds/treatments  Pain Scale 0-10  Pain Score 0  Take Vital Signs  Increase Vital Sign Frequency  Yellow: Q 2hr X 2 then Q 4hr X 2, if remains yellow, continue Q 4hrs  Escalate  MEWS: Escalate Yellow: discuss with charge nurse/RN and consider discussing with provider and RRT  Notify: Charge Nurse/RN  Name of Charge Nurse/RN Notified Hilbert Odor, RN  Date Charge Nurse/RN Notified 11/10/19  Time Charge Nurse/RN Notified 1044  Notify: Provider  Provider Name/Title Dr. Priscella Mann   Date Provider Notified 11/10/19  Time Provider Notified 1040  Notification Type Page  Notification Reason Change in status  Response No new orders (assessed)  Date of Provider Response 11/10/19  Time of Provider Response 1042  Notify: Rapid Response  Name of Rapid Response RN Notified  (not notified)  Document  Patient Outcome Not stable and remains on department  Yellow MEWS score protocol has been initiated, MD, charge nurse and nurse tech have been notified

## 2019-11-10 NOTE — Consult Note (Signed)
Pulmonary Medicine          Date: 11/10/2019,   MRN# [id number] Austin Kim 1955/06/30     AdmissionWeight: 72.6 kg                 CurrentWeight: 72.6 kg   Referring physician: Dr Priscella Mann   CHIEF COMPLAINT:   Increased O2 requirement with interstitial lung disease   HISTORY OF PRESENT ILLNESS    This is a 64yo M hx of emphysema with COPD, chronic hypoxemia, hx of secondary spontaneous pneumothorax s/p pleurodesis who came in due to acute on chornic hypoxemic respiratory failure with respiratory distress. He reports worsening respiratory status over past 1 month and decreased activity from baseline. He also admits to cough which is worse then his usual cough. On arrival his SpO2 was 81%. He had blood work done on arrival with leukocytosis and CXR done in ED showed worsening infiltrates worse at bases bilaterally. He had CTPE protocol done which was negative for PE but did have acute on chronic GGO opacities at bases suggestive of pneumonia. Patient was at beach with wife last month and had covid testing prior to trip,he is vaccinated against covidx2 and had PPSV23 shot for strep pneumoniae.  Patient was negative for COVID flu and RSV on arrival. Pulmonary consultation placed due to persistent cough with worsening symptoms in patient who has complex pulmonary history and is relatively immunosuppresed due to chronic systemic steroid therapy.    PAST MEDICAL HISTORY   Past Medical History:  Diagnosis Date  . Blindness of right eye   . COPD (chronic obstructive pulmonary disease) ([place])   . Elevated lipids   . Hypertension   . Spontaneous pneumothorax      SURGICAL HISTORY   Past Surgical History:  Procedure Laterality Date  . BASAL CELL CARCINOMA EXCISION Bilateral   . COLONOSCOPY WITH PROPOFOL N/A 07/28/2017   Procedure: COLONOSCOPY WITH PROPOFOL;  Surgeon: Lollie Sails, MD;  Location[facility] ENDOSCOPY;  Service: Endoscopy;  Laterality: N/A;  . EYE  SURGERY    . PLEURAL SCARIFICATION       FAMILY HISTORY   Family History  Problem Relation Age of Onset  . Diabetes Father   . CAD Father   . Colon cancer Father   . Heart disease Father        Heart transplant  . Diabetes Brother        Type 1 DM     SOCIAL HISTORY   Social History   Tobacco Use  . Smoking status: Former Smoker    Quit date: 2001    Years since quitting: 20.7  . Smokeless tobacco: Never Used  . Tobacco comment: 2 PPD for ~20 years  Vaping Use  . Vaping Use: Never used  Substance Use Topics  . Alcohol use: Yes  . Drug use: No     MEDICATIONS    Home Medication:    Current Medication:  Current Facility-Administered Medications:  .  amLODipine (NORVASC) tablet 5 mg, 5 mg, Oral, 1 day or 1 dose, Zada Finders R, MD, 5 mg at 11/09/19 1943 .  benzonatate (TESSALON) capsule 200 mg, 200 mg, Oral, TID, Sreenath, Sudheer B, MD, 200 mg at 11/10/19 1020 .  cefdinir (OMNICEF) capsule 300 mg, 300 mg, Oral, Q12H, Sreenath, Sudheer B, MD, 300 mg at 11/10/19 1020 .  enoxaparin (LOVENOX) injection 40 mg, 40 mg, Subcutaneous, Q24H, Zada Finders R, MD, 40 mg at 11/09/19 2210 .  feeding supplement (  ENSURE ENLIVE) (ENSURE ENLIVE) liquid 237 mL, 237 mL, Oral, BID BM, Sreenath, Sudheer B, MD, 237 mL at 11/09/19 1448 .  guaiFENesin (ROBITUSSIN) 100 MG/5ML solution 100 mg, 5 mL, Oral, Q4H PRN, Jennye Boroughs, MD, 100 mg at 11/08/19 1424 .  HYDROcodone-homatropine (HYCODAN) 5-1.5 MG/5ML syrup 5 mL, 5 mL, Oral, Q6H PRN, Jennye Boroughs, MD, 5 mL at 11/09/19 2210 .  influenza vac split quadrivalent PF (FLUARIX) injection 0.5 mL, 0.5 mL, Intramuscular, Prior to discharge, Zada Finders R, MD .  ipratropium-albuterol (DUONEB) 0.5-2.5 (3) MG/3ML nebulizer solution 3 mL, 3 mL, Nebulization, Q6H PRN, Sreenath, Sudheer B, MD .  methylPREDNISolone sodium succinate (SOLU-MEDROL) 40 mg/mL injection 40 mg, 40 mg, Intravenous, Q12H, Zada Finders R, MD, 40 mg at 11/10/19 1020 .   metoprolol tartrate (LOPRESSOR) tablet 50 mg, 50 mg, Oral, BID, Zada Finders R, MD, 50 mg at 11/10/19 1020    ALLERGIES   Patient has no known allergies.     REVIEW OF SYSTEMS    Review of Systems:  Gen:  Denies  fever, sweats, chills weigh loss  HEENT: Denies blurred vision, double vision, ear pain, eye pain, hearing loss, nose bleeds, sore throat Cardiac:  No dizziness, chest pain or heaviness, chest tightness,edema Resp:   Denies cough or sputum porduction, shortness of breath,wheezing, hemoptysis,  Gi: Denies swallowing difficulty, stomach pain, nausea or vomiting, diarrhea, constipation, bowel incontinence Gu:  Denies bladder incontinence, burning urine Ext:   Denies Joint pain, stiffness or swelling Skin: Denies  skin rash, easy bruising or bleeding or hives Endoc:  Denies polyuria, polydipsia , polyphagia or weight change Psych:   Denies depression, insomnia or hallucinations   Other:  All other systems negative   VS: BP 132/78 (BP Location: Left Arm)   Pulse (!) 115   Temp 98.3 F (36.8 C) (Oral)   Resp 19   Ht 5\' 9"  (1.753 m)   Wt 72.6 kg   SpO2 95%   BMI 23.63 kg/m      PHYSICAL EXAM    GENERAL:NAD, no fevers, chills, no weakness no fatigue HEAD: Normocephalic, atraumatic.  EYES: Pupils equal, round, reactive to light. Extraocular muscles intact. No scleral icterus.  MOUTH: Moist mucosal membrane. Dentition intact. No abscess noted.  EAR, NOSE, THROAT: Clear without exudates. No external lesions.  NECK: Supple. No thyromegaly. No nodules. No JVD.  PULMONARY: velcro crepitations noted.  CARDIOVASCULAR: S1 and S2. Regular rate and rhythm. No murmurs, rubs, or gallops. No edema. Pedal pulses 2+ bilaterally.  GASTROINTESTINAL: Soft, nontender, nondistended. No masses. Positive bowel sounds. No hepatosplenomegaly.  MUSCULOSKELETAL: No swelling, clubbing, or edema. Range of motion full in all extremities.  NEUROLOGIC: Cranial nerves II through XII are  intact. No gross focal neurological deficits. Sensation intact. Reflexes intact.  SKIN: No ulceration, lesions, rashes, or cyanosis. Skin warm and dry. Turgor intact.  PSYCHIATRIC: Mood, affect within normal limits. The patient is awake, alert and oriented x 3. Insight, judgment intact.       IMAGING    DG Chest 2 View  Result Date: 11/06/2019 CLINICAL DATA:  Hypoxia EXAM: CHEST - 2 VIEW COMPARISON:  January 18, 2018 FINDINGS: The cardiomediastinal silhouette is unchanged in contour.There are diffuse coarse interstitial opacities which have increased in comparison to prior in 2019. These are predominantly increased in the bases. Trace fluid in the RIGHT fissure. Biapical pleuroparenchymal scarring, unchanged. No pleural effusion. No pneumothorax. Visualized abdomen is unremarkable. Wedging of an upper upper lumbar vertebral body, unchanged. Atherosclerotic calcifications of the aorta.  IMPRESSION: Diffuse coarse interstitial opacities, predominantly increased in the lung bases. Findings may reflect worsened underlying pulmonary fibrosis versus superimposed infection or edema on a background of pulmonary fibrosis. Electronically Signed   By: Valentino Saxon MD   On: 11/06/2019 16:55   CT Angio Chest PE W and/or Wo Contrast  Result Date: 11/06/2019 CLINICAL DATA:  Worsening shortness of breath EXAM: CT ANGIOGRAPHY CHEST WITH CONTRAST TECHNIQUE: Multidetector CT imaging of the chest was performed using the standard protocol during bolus administration of intravenous contrast. Multiplanar CT image reconstructions and MIPs were obtained to evaluate the vascular anatomy. CONTRAST:  58mL OMNIPAQUE IOHEXOL 350 MG/ML SOLN COMPARISON:  CT from 01/18/2018, chest x-ray from earlier in the same day. FINDINGS: Cardiovascular: Thoracic aorta demonstrates mild atherosclerotic calcifications without aneurysmal dilatation or dissection. No significant cardiac enlargement is seen. Coronary calcifications are noted.  The pulmonary artery shows a normal branching pattern without evidence of pulmonary embolism. Mediastinum/Nodes: Thoracic inlet is within normal limits. Scattered small mediastinal and hilar lymph nodes are noted increased from the prior exam but likely of a reactive nature given the changes in lung parenchyma. The esophagus as visualized is within normal limits. Lungs/Pleura: Chronic emphysematous and fibrotic changes are noted within the lungs bilaterally similar to that seen on prior CT examination. Additionally there are patchy predominately ground-glass opacities identified throughout both lung bases and to a lesser degree in the upper lobes bilaterally new from the prior exam consistent with an acute atypical pneumonia superimposed over the chronic fibrotic changes. No sizable effusion is seen. Previously seen Peri fissural nodularity in the left lower lobe is again identified but slightly less prominent consistent with a benign etiology likely related to scarring. No new sizable parenchymal nodules are seen. Upper Abdomen: Visualized upper abdomen demonstrates a small cyst within the liver stable from the prior exam. Mild adrenal thickening is again noted and stable. No acute abnormality in the upper abdomen is seen. Musculoskeletal: Degenerative changes of the thoracic spine are noted. Review of the MIP images confirms the above findings. IMPRESSION: Progressive acute on chronic infiltrate in the lower lobes bilaterally consistent with atypical pneumonia. No evidence of pulmonary emboli. Persistent chronic fibrotic changes similar to that seen on the prior exam. Aortic Atherosclerosis (ICD10-I70.0) and Emphysema (ICD10-J43.9). Electronically Signed   By: Inez Catalina M.D.   On: 11/06/2019 19:24   ECHOCARDIOGRAM COMPLETE  Result Date: 11/08/2019    ECHOCARDIOGRAM REPORT   Patient Name:   Austin Kim Date of Exam: 11/07/2019 Medical Rec #:  [id number]          Height:       69.0 in Accession #:     [id number]         Weight:       160.0 lb Date of Birth:  October 12, 1955          BSA:          1.879 m Patient Age:    59 years           BP:           138/89 mmHg Patient Gender: M                  HR:           99 bpm. Exam Location:  [facility] Procedure: 2D Echo Indications:     DYSPNEA 786.09/R06.00  History:         Patient has no prior history of Echocardiogram examinations.  Risk Factors:Hypertension.  Sonographer:     Avanell Shackleton Referring Phys:  [id number] Cleaster Corin PATEL Diagnosing Phys: Yolonda Kida MD IMPRESSIONS  1. Bordrline inferior hypo.  2. Left ventricular ejection fraction, by estimation, is 60 to 65%. The left ventricle has normal function. The left ventricle demonstrates regional wall motion abnormalities (see scoring diagram/findings for description). Left ventricular diastolic parameters were normal.  3. Right ventricular systolic function is normal. The right ventricular size is normal. There is normal pulmonary artery systolic pressure.  4. The mitral valve is normal in structure. Trivial mitral valve regurgitation.  5. The aortic valve is normal in structure. Aortic valve regurgitation is not visualized. FINDINGS  Left Ventricle: Left ventricular ejection fraction, by estimation, is 60 to 65%. The left ventricle has normal function. The left ventricle demonstrates regional wall motion abnormalities. The left ventricular internal cavity size was normal in size. There is no left ventricular hypertrophy. Left ventricular diastolic parameters were normal.  LV Wall Scoring: The basal inferolateral segment and basal inferior segment are hypokinetic. Right Ventricle: The right ventricular size is normal. No increase in right ventricular wall thickness. Right ventricular systolic function is normal. There is normal pulmonary artery systolic pressure. Left Atrium: Left atrial size was normal in size. Right Atrium: Right atrial size was normal in size. Pericardium: There is no evidence of  pericardial effusion. Mitral Valve: The mitral valve is normal in structure. Trivial mitral valve regurgitation. Tricuspid Valve: The tricuspid valve is normal in structure. Tricuspid valve regurgitation is trivial. Aortic Valve: The aortic valve is normal in structure. Aortic valve regurgitation is not visualized. Pulmonic Valve: The pulmonic valve was grossly normal. Pulmonic valve regurgitation is not visualized. Aorta: The aortic root is normal in size and structure. IAS/Shunts: No atrial level shunt detected by color flow Doppler. Additional Comments: Bordrline inferior hypo.  LEFT VENTRICLE PLAX 2D LVIDd:         4.57 cm  Diastology LVIDs:         2.82 cm  LV e' medial:   9.03 cm/s LV PW:         0.76 cm  LV E/e' medial: 9.8 LV IVS:        0.83 cm LVOT diam:     1.80 cm LVOT Area:     2.54 cm  RIGHT VENTRICLE             IVC RV S prime:     14.80 cm/s  IVC diam: 1.85 cm TAPSE (M-mode): 3.4 cm LEFT ATRIUM           Index       RIGHT ATRIUM           Index LA diam:      4.00 cm 2.13 cm/m  RA Area:     18.10 cm LA Vol (A4C): 50.8 ml 27.03 ml/m RA Volume:   49.40 ml  26.29 ml/m   AORTA Ao Root diam: 3.50 cm MITRAL VALVE MV Area (PHT): 4.06 cm    SHUNTS MV Decel Time: 187 msec    Systemic Diam: 1.80 cm MV E velocity: 88.80 cm/s MV A velocity: 86.30 cm/s MV E/A ratio:  1.03 Dwayne D Callwood MD Electronically signed by Yolonda Kida MD Signature Date/Time: 11/08/2019/7:53:52 AM    Final        ASSESSMENT/PLAN    Acute on chronic hypoxemic respiratory failure   - patient has combined pulmonary fibrosis and emphysema (CPFE)   - he has pulmonary edema on CT  chest along with chronic bullous emphysema and pulmonary fibrosis   - he had leukocytosis on admission indicative of acute infectious etiology - will add RVP to r/o other viral etiologies as well as walking pneumonia  - fungitell to evaluate for fungal involvement   -patient is on no inhalers at all.    -he has no oxygen at home.   -he will  need full workup and therapy on outpatient.     Thank you for allowing me to participate in the care of this patient.  .   Patient/Family are satisfied with care plan and all questions have been answered.  This document was prepared using Dragon voice recognition software and may include unintentional dictation errors.     Ottie Glazier, M.D.  Division of [place]

## 2019-11-10 NOTE — Progress Notes (Signed)
PROGRESS NOTE    Austin Kim  QAS:[id number] DOB: September 26, 1955 DOA: 11/06/2019 PCP: Juluis Pitch, MD  Brief Narrative:  Austin Kim is a 64 y.o. male  with medical history significant foremphysema not requiring home O2, former smoker (quit smoking cigarettes in 2002), remote history of spontaneous pneumothorax requiring pleurodesis,hypertension, and hyperlipidemia.  He presented to the hospital because of shortness of breath that is worse with exertion, nonproductive cough, generalized weakness and fatigue. His oxygen saturation was 88% on room air and went down to 81% with ambulation.  CTA chest showed persistent chronic fibrotic changes and progressive acute on chronic infiltration bilateral lower lobes suggestive of atypical pneumonia.  He was admitted to the hospital for pneumonia, COPD exacerbation and acute hypoxemic respiratory failure.  He was treated with IV antibiotics, steroids and bronchodilators.  He remained fairly symptomatic and hypoxic despite the above interventions.  Pulmonary consultation requested.  Case discussed with Dr. Lanney Gins.  Fibrotic lung disease of unclear etiology.  Will need more extensive outpatient work-up.  Assessment & Plan:   Principal Problem:   ILD (interstitial lung disease) ([place]) Active Problems:   Hypertension   Acute respiratory failure with hypoxia (HCC)   Hyponatremia   Atypical pneumonia   COPD with acute exacerbation ([facility])   Malnutrition of moderate degree  Acute hypoxemic respiratory failure COPD with acute exacerbation Suspected interstitial lung disease Possible atypical pneumonia Patient has had known COPD for several years He was previously established with Dr. Raul Del from [facility] clinic pulmonology He has not seen him in several years as his respiratory status was stable Suspect this presentation represents an exacerbation of underlying ILD with concomitant COPD -Pulmonary consultation requested.  Case  discussed with pulmonary consultant.  Suspect underlying fibrotic lung disease of unclear etiology.  Patient will need more extensive outpatient diagnostic evaluation. Plan: Continue oxygen, wean as tolerated Home oxygen ordered Continue Solu-Medrol Hycodan and Tessalon for cough suppression MetaNebs every 6 hours per pulmonary consultant Outpatient pulmonary follow-up for further diagnostic evaluation  Hypertension Currently reasonably well controlled Continue current antihypertensives  Hyponatremia SIADH versus volume depletion Mild Daily BMP   DVT prophylaxis: Lovenox Code Status: Full Family Communication: Wife at bedside  disposition Plan: Status is: Inpatient  Remains inpatient appropriate because:Inpatient level of care appropriate due to severity of illness   Dispo: The patient is from: Home              Anticipated d/c is to: Home              Anticipated d/c date is: 2 days              Patient currently is not medically stable to d/c.   Patient remains hypoxic and symptomatic.  Still with significant cough and rapid desaturation with minimal exertion.  Will need improvement in oxygen parameters prior to disposition planning.   Consultants:   None  Procedures:   None  Antimicrobials:  Azithromycin  Ceftriaxone   Subjective: Seen and examined.  Endorsing cough and quick desaturation with minimal exertion  Objective: Vitals:   11/09/19 1119 11/09/19 2047 11/10/19 0438 11/10/19 1019  BP: 139/84 (!) 145/75 131/71 132/78  Pulse: 76 94 85 (!) 115  Resp: 20 20 20 19   Temp: 98.3 F (36.8 C) 98.6 F (37 C) 98.1 F (36.7 C) 98.3 F (36.8 C)  TempSrc: Oral Oral Oral Oral  SpO2: 96% 92% 96% 95%  Weight:      Height:  No intake or output data in the 24 hours ending 11/10/19 1303 Filed Weights   11/06/19 1038  Weight: 72.6 kg    Examination:  General exam: Appears calm and comfortable  Respiratory system: Diffuse coarse crackles  bilaterally.  4 L nasal cannula.  Normal work of breathing cardiovascular system: S1 & S2 heard, RRR. No JVD, murmurs, rubs, gallops or clicks. No pedal edema. Gastrointestinal system: Abdomen is nondistended, soft and nontender. No organomegaly or masses felt. Normal bowel sounds heard. Central nervous system: Alert and oriented. No focal neurological deficits. Extremities: Symmetric 5 x 5 power. Skin: No rashes, lesions or ulcers Psychiatry: Judgement and insight appear normal. Mood & affect appropriate.     Data Reviewed: I have personally reviewed following labs and imaging studies  CBC: Recent Labs  Lab 11/06/19 1042 11/07/19 0654  WBC 13.8* 8.4  HGB 13.3 13.0  HCT 38.9* 37.0*  MCV 91.1 88.5  PLT 340 836   Basic Metabolic Panel: Recent Labs  Lab 11/06/19 1042 11/07/19 0654 11/08/19 1025  NA 128* 133* 133*  K 4.1 4.5 4.3  CL 94* 98 96*  CO2 24 25 28   GLUCOSE 136* 146* 147*  BUN 8 10 17   CREATININE 0.77 0.79 0.74  CALCIUM 8.5* 8.6* 8.8*   GFR: Estimated Creatinine Clearance: 95.7 mL/min (by C-G formula based on SCr of 0.74 mg/dL). Liver Function Tests: Recent Labs  Lab 11/06/19 1042  AST 26  ALT 16  ALKPHOS 75  BILITOT 1.1  PROT 7.2  ALBUMIN 3.4*   No results for input(s): LIPASE, AMYLASE in the last 168 hours. No results for input(s): AMMONIA in the last 168 hours. Coagulation Profile: No results for input(s): INR, PROTIME in the last 168 hours. Cardiac Enzymes: No results for input(s): CKTOTAL, CKMB, CKMBINDEX, TROPONINI in the last 168 hours. BNP (last 3 results) No results for input(s): PROBNP in the last 8760 hours. HbA1C: No results for input(s): HGBA1C in the last 72 hours. CBG: No results for input(s): GLUCAP in the last 168 hours. Lipid Profile: No results for input(s): CHOL, HDL, LDLCALC, TRIG, CHOLHDL, LDLDIRECT in the last 72 hours. Thyroid Function Tests: No results for input(s): TSH, T4TOTAL, FREET4, T3FREE, THYROIDAB in the last 72  hours. Anemia Panel: No results for input(s): VITAMINB12, FOLATE, FERRITIN, TIBC, IRON, RETICCTPCT in the last 72 hours. Sepsis Labs: Recent Labs  Lab 11/06/19 1729  PROCALCITON 0.12    Recent Results (from the past 240 hour(s))  Resp Panel by RT PCR (RSV, Flu A&B, Covid) - Nasopharyngeal Swab     Status: None   Collection Time: 11/06/19  5:29 PM   Specimen: Nasopharyngeal Swab  Result Value Ref Range Status   SARS Coronavirus 2 by RT PCR NEGATIVE NEGATIVE Final    Comment: (NOTE) SARS-CoV-2 target nucleic acids are NOT DETECTED.  The SARS-CoV-2 RNA is generally detectable in upper respiratoy specimens during the acute phase of infection. The lowest concentration of SARS-CoV-2 viral copies this assay can detect is 131 copies/mL. A negative result does not preclude SARS-Cov-2 infection and should not be used as the sole basis for treatment or other patient management decisions. A negative result may occur with  improper specimen collection/handling, submission of specimen other than nasopharyngeal swab, presence of viral mutation(s) within the areas targeted by this assay, and inadequate number of viral copies (<131 copies/mL). A negative result must be combined with clinical observations, patient history, and epidemiological information. The expected result is Negative.  Fact Sheet for Patients:  PinkCheek.be  Fact Sheet for Healthcare Providers:  GravelBags.it  This test is no t yet approved or cleared by the [place] FDA and  has been authorized for detection and/or diagnosis of SARS-CoV-2 by FDA under an Emergency Use Authorization (EUA). This EUA will remain  in effect (meaning this test can be used) for the duration of the COVID-19 declaration under Section 564(b)(1) of the Act, 21 U.S.C. section 360bbb-3(b)(1), unless the authorization is terminated or revoked sooner.     Influenza A by PCR NEGATIVE  NEGATIVE Final   Influenza B by PCR NEGATIVE NEGATIVE Final    Comment: (NOTE) The Xpert Xpress SARS-CoV-2/FLU/RSV assay is intended as an aid in  the diagnosis of influenza from Nasopharyngeal swab specimens and  should not be used as a sole basis for treatment. Nasal washings and  aspirates are unacceptable for Xpert Xpress SARS-CoV-2/FLU/RSV  testing.  Fact Sheet for Patients: PinkCheek.be  Fact Sheet for Healthcare Providers: GravelBags.it  This test is not yet approved or cleared by the [place] FDA and  has been authorized for detection and/or diagnosis of SARS-CoV-2 by  FDA under an Emergency Use Authorization (EUA). This EUA will remain  in effect (meaning this test can be used) for the duration of the  Covid-19 declaration under Section 564(b)(1) of the Act, 21  U.S.C. section 360bbb-3(b)(1), unless the authorization is  terminated or revoked.    Respiratory Syncytial Virus by PCR NEGATIVE NEGATIVE Final    Comment: (NOTE) Fact Sheet for Patients: PinkCheek.be  Fact Sheet for Healthcare Providers: GravelBags.it  This test is not yet approved or cleared by the [place] FDA and  has been authorized for detection and/or diagnosis of SARS-CoV-2 by  FDA under an Emergency Use Authorization (EUA). This EUA will remain  in effect (meaning this test can be used) for the duration of the  COVID-19 declaration under Section 564(b)(1) of the Act, 21 U.S.C.  section 360bbb-3(b)(1), unless the authorization is terminated or  revoked. Performed at [facility], [address], [place], [place] [address]          Radiology Studies: No results found.      Scheduled Meds: . amLODipine  5 mg Oral 1 day or 1 dose  . benzonatate  200 mg Oral TID  . cefdinir  300 mg Oral Q12H  . enoxaparin (LOVENOX) injection  40 mg Subcutaneous Q24H  .  feeding supplement (ENSURE ENLIVE)  237 mL Oral BID BM  . furosemide  40 mg Intravenous Q12H  . methylPREDNISolone (SOLU-MEDROL) injection  40 mg Intravenous Q12H  . metoprolol tartrate  50 mg Oral BID   Continuous Infusions:   LOS: 3 days    Time spent: 25 minutes    Sidney Ace, MD [facility] Pager [phone]  If 7PM-7AM, please contact night-coverage 11/10/2019, 1:03 PM

## 2019-11-11 ENCOUNTER — Inpatient Hospital Stay: Payer: BC Managed Care – PPO

## 2019-11-11 LAB — BASIC METABOLIC PANEL
Anion gap: 11 (ref 5–15)
BUN: 19 mg/dL (ref 8–23)
CO2: 27 mmol/L (ref 22–32)
Calcium: 8.7 mg/dL — ABNORMAL LOW (ref 8.9–10.3)
Chloride: 95 mmol/L — ABNORMAL LOW (ref 98–111)
Creatinine, Ser: 0.85 mg/dL (ref 0.61–1.24)
GFR calc Af Amer: >60 mL/min (ref >60)
GFR calc non Af Amer: >60 mL/min (ref >60)
Glucose, Bld: 142 mg/dL — ABNORMAL HIGH (ref 70–99)
Potassium: 4.2 mmol/L (ref 3.5–5.1)
Sodium: 133 mmol/L — ABNORMAL LOW (ref 135–145)

## 2019-11-11 LAB — CBC WITH DIFFERENTIAL/PLATELET
Abs Immature Granulocytes: 0.24 10*3/uL — ABNORMAL HIGH (ref 0.00–0.07)
Basophils Absolute: 0 10*3/uL (ref 0.0–0.1)
Basophils Relative: 0 %
Eosinophils Absolute: 0 10*3/uL (ref 0.0–0.5)
Eosinophils Relative: 0 %
HCT: 42.1 % (ref 39.0–52.0)
Hemoglobin: 14.3 g/dL (ref 13.0–17.0)
Immature Granulocytes: 2 %
Lymphocytes Relative: 11 %
Lymphs Abs: 1.5 10*3/uL (ref 0.7–4.0)
MCH: 30.9 pg (ref 26.0–34.0)
MCHC: 34 g/dL (ref 30.0–36.0)
MCV: 90.9 fL (ref 80.0–100.0)
Monocytes Absolute: 1.3 10*3/uL — ABNORMAL HIGH (ref 0.1–1.0)
Monocytes Relative: 10 %
Neutro Abs: 10.3 10*3/uL — ABNORMAL HIGH (ref 1.7–7.7)
Neutrophils Relative %: 77 %
Platelets: 376 10*3/uL (ref 150–400)
RBC: 4.63 MIL/uL (ref 4.22–5.81)
RDW: 12 % (ref 11.5–15.5)
WBC: 13.3 10*3/uL — ABNORMAL HIGH (ref 4.0–10.5)
nRBC: 0 % (ref 0.0–0.2)

## 2019-11-11 MED ORDER — SALINE SPRAY 0.65 % NA SOLN
1.0000 | NASAL | Status: DC | PRN
  Filled 2019-11-11: qty 44

## 2019-11-11 MED ORDER — IPRATROPIUM-ALBUTEROL 0.5-2.5 (3) MG/3ML IN SOLN
3.0000 mL | Freq: Two times a day (BID) | RESPIRATORY_TRACT | Status: DC
  Administered 2019-11-11 – 2019-11-12 (×2): 3 mL via RESPIRATORY_TRACT
  Filled 2019-11-11 (×2): qty 3

## 2019-11-11 MED ORDER — METHYLPREDNISOLONE SODIUM SUCC 40 MG IJ SOLR: 40.0000 mg | Freq: Every day | INTRAMUSCULAR | Status: DC

## 2019-11-11 MED ORDER — FUROSEMIDE 10 MG/ML IJ SOLN: 40.0000 mg | Freq: Every day | INTRAMUSCULAR | Status: DC

## 2019-11-11 MED ORDER — FLUTICASONE PROPIONATE 50 MCG/ACT NA SUSP
1.0000 | Freq: Every day | NASAL | Status: DC
  Administered 2019-11-11 – 2019-11-13 (×3): 1 via NASAL
  Filled 2019-11-11: qty 16

## 2019-11-11 MED ORDER — ALBUTEROL SULFATE (2.5 MG/3ML) 0.083% IN NEBU: 2.5000 mg | INHALATION_SOLUTION | RESPIRATORY_TRACT | Status: DC | PRN

## 2019-11-11 NOTE — Progress Notes (Signed)
Pulmonary Medicine          Date: 11/11/2019,   MRN# [id number] Austin Kim 01-04-1956     AdmissionWeight: 72.6 kg                 CurrentWeight: 72.6 kg   Referring physician: Dr Priscella Mann   CHIEF COMPLAINT:   Increased O2 requirement with interstitial lung disease   HISTORY OF PRESENT ILLNESS    This is a 64yo M hx of emphysema with COPD, chronic hypoxemia, hx of secondary spontaneous pneumothorax s/p pleurodesis who came in due to acute on chornic hypoxemic respiratory failure with respiratory distress. He reports worsening respiratory status over past 1 month and decreased activity from baseline. He also admits to cough which is worse then his usual cough. On arrival his SpO2 was 81%. He had blood work done on arrival with leukocytosis and CXR done in ED showed worsening infiltrates worse at bases bilaterally. He had CTPE protocol done which was negative for PE but did have acute on chronic GGO opacities at bases suggestive of pneumonia. Patient was at beach with wife last month and had covid testing prior to trip,he is vaccinated against covidx2 and had PPSV23 shot for strep pneumoniae.  Patient was negative for COVID flu and RSV on arrival. Pulmonary consultation placed due to persistent cough with worsening symptoms in patient who has complex pulmonary history and is relatively immunosuppresed due to chronic systemic steroid therapy.   11/11/19- patient is improved , >1.5L UOP overnight. Lung auscultation is improved. PT/OT again today. Lasix decreased to once daily today.  Wbc count trend up after steroids, have decrased solumedrol to 40 IV daily from BID. We discussed potential for d/c planning due to being on room air now, although he does desaturate with exertion.    PAST MEDICAL HISTORY   Past Medical History:  Diagnosis Date   Blindness of right eye    COPD (chronic obstructive pulmonary disease) (HCC)    Elevated lipids    Hypertension     Spontaneous pneumothorax      SURGICAL HISTORY   Past Surgical History:  Procedure Laterality Date   BASAL CELL CARCINOMA EXCISION Bilateral    COLONOSCOPY WITH PROPOFOL N/A 07/28/2017   Procedure: COLONOSCOPY WITH PROPOFOL;  Surgeon: Lollie Sails, MD;  Location: [facility] ENDOSCOPY;  Service: Endoscopy;  Laterality: N/A;   EYE SURGERY     PLEURAL SCARIFICATION       FAMILY HISTORY   Family History  Problem Relation Age of Onset   Diabetes Father    CAD Father    Colon cancer Father    Heart disease Father        Heart transplant   Diabetes Brother        Type 1 DM     SOCIAL HISTORY   Social History   Tobacco Use   Smoking status: Former Smoker    Quit date: 2001    Years since quitting: 20.7   Smokeless tobacco: Never Used   Tobacco comment: 2 PPD for ~20 years  Vaping Use   Vaping Use: Never used  Substance Use Topics   Alcohol use: Yes   Drug use: No     MEDICATIONS    Home Medication:    Current Medication:  Current Facility-Administered Medications:    amLODipine (NORVASC) tablet 5 mg, 5 mg, Oral, 1 day or 1 dose, Patel, Vishal R, MD, 5 mg at 11/10/19 1827   benzonatate (TESSALON) capsule 200  mg, 200 mg, Oral, TID, Sreenath, Sudheer B, MD, 200 mg at 11/10/19 2103   enoxaparin (LOVENOX) injection 40 mg, 40 mg, Subcutaneous, Q24H, Patel, Vishal R, MD, 40 mg at 11/10/19 2104   feeding supplement (ENSURE ENLIVE) (ENSURE ENLIVE) liquid 237 mL, 237 mL, Oral, BID BM, Sreenath, Sudheer B, MD, 237 mL at 11/10/19 1342   [START ON 11/12/2019] furosemide (LASIX) injection 40 mg, 40 mg, Intravenous, Daily, Tannya Gonet, MD   guaiFENesin (ROBITUSSIN) 100 MG/5ML solution 100 mg, 5 mL, Oral, Q4H PRN, Jennye Boroughs, MD, 100 mg at 11/08/19 1424   HYDROcodone-homatropine (HYCODAN) 5-1.5 MG/5ML syrup 5 mL, 5 mL, Oral, Q6H PRN, Jennye Boroughs, MD, 5 mL at 11/09/19 2210   influenza vac split quadrivalent PF (FLUARIX) injection 0.5 mL, 0.5 mL,  Intramuscular, Prior to discharge, Zada Finders R, MD   ipratropium-albuterol (DUONEB) 0.5-2.5 (3) MG/3ML nebulizer solution 3 mL, 3 mL, Nebulization, Q6H PRN, Priscella Mann, Sudheer B, MD, 3 mL at 11/11/19 0749   [START ON 11/12/2019] methylPREDNISolone sodium succinate (SOLU-MEDROL) 40 mg/mL injection 40 mg, 40 mg, Intravenous, Daily, Narvel Kozub, MD   metoprolol tartrate (LOPRESSOR) tablet 50 mg, 50 mg, Oral, BID, Zada Finders R, MD, 50 mg at 11/10/19 1827    ALLERGIES   Patient has no known allergies.     REVIEW OF SYSTEMS    Review of Systems:  Gen:  Denies  fever, sweats, chills weigh loss  HEENT: Denies blurred vision, double vision, ear pain, eye pain, hearing loss, nose bleeds, sore throat Cardiac:  No dizziness, chest pain or heaviness, chest tightness,edema Resp:   Denies cough or sputum porduction, shortness of breath,wheezing, hemoptysis,  Gi: Denies swallowing difficulty, stomach pain, nausea or vomiting, diarrhea, constipation, bowel incontinence Gu:  Denies bladder incontinence, burning urine Ext:   Denies Joint pain, stiffness or swelling Skin: Denies  skin rash, easy bruising or bleeding or hives Endoc:  Denies polyuria, polydipsia , polyphagia or weight change Psych:   Denies depression, insomnia or hallucinations   Other:  All other systems negative   VS: BP 131/75 (BP Location: Left Arm)    Pulse 91    Temp 97.7 F (36.5 C) (Oral)    Resp 18    Ht 5\' 9"  (1.753 m)    Wt 72.6 kg    SpO2 92%    BMI 23.63 kg/m      PHYSICAL EXAM    GENERAL:NAD, no fevers, chills, no weakness no fatigue HEAD: Normocephalic, atraumatic.  EYES: Pupils equal, round, reactive to light. Extraocular muscles intact. No scleral icterus.  MOUTH: Moist mucosal membrane. Dentition intact. No abscess noted.  EAR, NOSE, THROAT: Clear without exudates. No external lesions.  NECK: Supple. No thyromegaly. No nodules. No JVD.  PULMONARY: velcro crepitations noted.  CARDIOVASCULAR:  S1 and S2. Regular rate and rhythm. No murmurs, rubs, or gallops. No edema. Pedal pulses 2+ bilaterally.  GASTROINTESTINAL: Soft, nontender, nondistended. No masses. Positive bowel sounds. No hepatosplenomegaly.  MUSCULOSKELETAL: No swelling, clubbing, or edema. Range of motion full in all extremities.  NEUROLOGIC: Cranial nerves II through XII are intact. No gross focal neurological deficits. Sensation intact. Reflexes intact.  SKIN: No ulceration, lesions, rashes, or cyanosis. Skin warm and dry. Turgor intact.  PSYCHIATRIC: Mood, affect within normal limits. The patient is awake, alert and oriented x 3. Insight, judgment intact.       IMAGING    DG Chest 2 View  Result Date: 11/06/2019 CLINICAL DATA:  Hypoxia EXAM: CHEST - 2 VIEW COMPARISON:  January 18, 2018 FINDINGS: The cardiomediastinal silhouette is unchanged in contour.There are diffuse coarse interstitial opacities which have increased in comparison to prior in 2019. These are predominantly increased in the bases. Trace fluid in the RIGHT fissure. Biapical pleuroparenchymal scarring, unchanged. No pleural effusion. No pneumothorax. Visualized abdomen is unremarkable. Wedging of an upper upper lumbar vertebral body, unchanged. Atherosclerotic calcifications of the aorta. IMPRESSION: Diffuse coarse interstitial opacities, predominantly increased in the lung bases. Findings may reflect worsened underlying pulmonary fibrosis versus superimposed infection or edema on a background of pulmonary fibrosis. Electronically Signed   By: Valentino Saxon MD   On: 11/06/2019 16:55   CT Angio Chest PE W and/or Wo Contrast  Result Date: 11/06/2019 CLINICAL DATA:  Worsening shortness of breath EXAM: CT ANGIOGRAPHY CHEST WITH CONTRAST TECHNIQUE: Multidetector CT imaging of the chest was performed using the standard protocol during bolus administration of intravenous contrast. Multiplanar CT image reconstructions and MIPs were obtained to evaluate the  vascular anatomy. CONTRAST:  36mL OMNIPAQUE IOHEXOL 350 MG/ML SOLN COMPARISON:  CT from 01/18/2018, chest x-ray from earlier in the same day. FINDINGS: Cardiovascular: Thoracic aorta demonstrates mild atherosclerotic calcifications without aneurysmal dilatation or dissection. No significant cardiac enlargement is seen. Coronary calcifications are noted. The pulmonary artery shows a normal branching pattern without evidence of pulmonary embolism. Mediastinum/Nodes: Thoracic inlet is within normal limits. Scattered small mediastinal and hilar lymph nodes are noted increased from the prior exam but likely of a reactive nature given the changes in lung parenchyma. The esophagus as visualized is within normal limits. Lungs/Pleura: Chronic emphysematous and fibrotic changes are noted within the lungs bilaterally similar to that seen on prior CT examination. Additionally there are patchy predominately ground-glass opacities identified throughout both lung bases and to a lesser degree in the upper lobes bilaterally new from the prior exam consistent with an acute atypical pneumonia superimposed over the chronic fibrotic changes. No sizable effusion is seen. Previously seen Peri fissural nodularity in the left lower lobe is again identified but slightly less prominent consistent with a benign etiology likely related to scarring. No new sizable parenchymal nodules are seen. Upper Abdomen: Visualized upper abdomen demonstrates a small cyst within the liver stable from the prior exam. Mild adrenal thickening is again noted and stable. No acute abnormality in the upper abdomen is seen. Musculoskeletal: Degenerative changes of the thoracic spine are noted. Review of the MIP images confirms the above findings. IMPRESSION: Progressive acute on chronic infiltrate in the lower lobes bilaterally consistent with atypical pneumonia. No evidence of pulmonary emboli. Persistent chronic fibrotic changes similar to that seen on the prior  exam. Aortic Atherosclerosis (ICD10-I70.0) and Emphysema (ICD10-J43.9). Electronically Signed   By: Inez Catalina M.D.   On: 11/06/2019 19:24   ECHOCARDIOGRAM COMPLETE  Result Date: 11/08/2019    ECHOCARDIOGRAM REPORT   Patient Name:   Austin Kim Date of Exam: 11/07/2019 Medical Rec #:  [id number]          Height:       69.0 in Accession #:    [id number]         Weight:       160.0 lb Date of Birth:  01-11-1956          BSA:          1.879 m Patient Age:    57 years           BP:           138/89 mmHg Patient Gender: M  HR:           99 bpm. Exam Location:  [facility] Procedure: 2D Echo Indications:     DYSPNEA 786.09/R06.00  History:         Patient has no prior history of Echocardiogram examinations.                  Risk Factors:Hypertension.  Sonographer:     Avanell Shackleton Referring Phys:  [id number] Cleaster Corin PATEL Diagnosing Phys: Yolonda Kida MD IMPRESSIONS  1. Bordrline inferior hypo.  2. Left ventricular ejection fraction, by estimation, is 60 to 65%. The left ventricle has normal function. The left ventricle demonstrates regional wall motion abnormalities (see scoring diagram/findings for description). Left ventricular diastolic parameters were normal.  3. Right ventricular systolic function is normal. The right ventricular size is normal. There is normal pulmonary artery systolic pressure.  4. The mitral valve is normal in structure. Trivial mitral valve regurgitation.  5. The aortic valve is normal in structure. Aortic valve regurgitation is not visualized. FINDINGS  Left Ventricle: Left ventricular ejection fraction, by estimation, is 60 to 65%. The left ventricle has normal function. The left ventricle demonstrates regional wall motion abnormalities. The left ventricular internal cavity size was normal in size. There is no left ventricular hypertrophy. Left ventricular diastolic parameters were normal.  LV Wall Scoring: The basal inferolateral segment and basal inferior segment are  hypokinetic. Right Ventricle: The right ventricular size is normal. No increase in right ventricular wall thickness. Right ventricular systolic function is normal. There is normal pulmonary artery systolic pressure. Left Atrium: Left atrial size was normal in size. Right Atrium: Right atrial size was normal in size. Pericardium: There is no evidence of pericardial effusion. Mitral Valve: The mitral valve is normal in structure. Trivial mitral valve regurgitation. Tricuspid Valve: The tricuspid valve is normal in structure. Tricuspid valve regurgitation is trivial. Aortic Valve: The aortic valve is normal in structure. Aortic valve regurgitation is not visualized. Pulmonic Valve: The pulmonic valve was grossly normal. Pulmonic valve regurgitation is not visualized. Aorta: The aortic root is normal in size and structure. IAS/Shunts: No atrial level shunt detected by color flow Doppler. Additional Comments: Bordrline inferior hypo.  LEFT VENTRICLE PLAX 2D LVIDd:         4.57 cm  Diastology LVIDs:         2.82 cm  LV e' medial:   9.03 cm/s LV PW:         0.76 cm  LV E/e' medial: 9.8 LV IVS:        0.83 cm LVOT diam:     1.80 cm LVOT Area:     2.54 cm  RIGHT VENTRICLE             IVC RV S prime:     14.80 cm/s  IVC diam: 1.85 cm TAPSE (M-mode): 3.4 cm LEFT ATRIUM           Index       RIGHT ATRIUM           Index LA diam:      4.00 cm 2.13 cm/m  RA Area:     18.10 cm LA Vol (A4C): 50.8 ml 27.03 ml/m RA Volume:   49.40 ml  26.29 ml/m   AORTA Ao Root diam: 3.50 cm MITRAL VALVE MV Area (PHT): 4.06 cm    SHUNTS MV Decel Time: 187 msec    Systemic Diam: 1.80 cm MV E velocity: 88.80 cm/s MV A velocity: 86.30  cm/s MV E/A ratio:  1.03 Dwayne D Callwood MD Electronically signed by Yolonda Kida MD Signature Date/Time: 11/08/2019/7:53:52 AM    Final        ASSESSMENT/PLAN    Acute on chronic hypoxemic respiratory failure   - patient has combined pulmonary fibrosis and emphysema (CPFE)   - he has pulmonary edema  on CT chest along with chronic bullous emphysema and pulmonary fibrosis   - he had leukocytosis on admission indicative of acute infectious etiology - will add RVP to r/o other viral etiologies as well as walking pneumonia  - fungitell to evaluate for fungal involvement   -patient is on no inhalers at all.    -he has no oxygen at home.   -he will need full workup and therapy on outpatient.  -will need to be evaluated for ILD, alpha 1 antitrypsin def, patient has elevated rheumatoid factor but remainder of autoimmune workup is negative.    Thank you for allowing me to participate in the care of this patient.  .   Patient/Family are satisfied with care plan and all questions have been answered.  This document was prepared using Dragon voice recognition software and may include unintentional dictation errors.     Ottie Glazier, M.D.  Division of [facility]

## 2019-11-11 NOTE — Progress Notes (Signed)
Occupational Therapy Treatment Patient Details Name: Austin Kim MRN: [id number] DOB: [birth year] Today's Date: 11/11/2019    History of present illness 65 y.o. male  with medical history significant for emphysema not requiring home O2,  former smoker (quit smoking cigarettes in 2002), remote history of spontaneous pneumothorax requiring pleurodesis, hypertension, and hyperlipidemia.  He presented to the hospital because of shortness of breath that is worse with exertion, nonproductive cough, generalized weakness and fatigue.His oxygen saturation was 88% on room air and went down to 81% with ambulation.  CTA chest showed persistent chronic fibrotic changes and progressive acute on chronic infiltration bilateral lower lobes suggestive of atypical pneumonia.   OT comments  Upon entering the room, pt seated in bed with wife present. OT providing pt and caregiver with energy conservation education handout. OT discussed various examples such as self care tasks, laundry, going to appointments, and community mobility and how energy conservation is essential with these tasks and how to incorporate various strategies. Pt and caregiver report understanding and engaged in this conversation/edu with therapist. Pt is most concerned about returning to work and being able to live life "like before". Pt asked to read education handouts in further detail to be discussed further as needed. Pt continues to benefit from acute OT intervention.   Follow Up Recommendations  Home health OT;Supervision - Intermittent    Equipment Recommendations  Tub/shower seat    Recommendations for Other Services Other (comment) (none at this time)    Precautions / Restrictions Precautions Precautions: Fall          Balance Overall balance assessment: Needs assistance Sitting-balance support: Feet supported Sitting balance-Leahy Scale: Normal     Standing balance support: During functional activity Standing  balance-Leahy Scale: Good      ADL either performed or assessed with clinical judgement        Vision Baseline Vision/History: No visual deficits;Wears glasses Wears Glasses: At all times Patient Visual Report: No change from baseline            Cognition Arousal/Alertness: Awake/alert Behavior During Therapy: WFL for tasks assessed/performed Overall Cognitive Status: Within Functional Limits for tasks assessed                   Pertinent Vitals/ Pain       Pain Assessment: No/denies pain  Home Living Family/patient expects to be discharged to:: Private residence Living Arrangements: Spouse/significant other           Frequency  Min 1X/week        Progress Toward Goals  OT Goals(current goals can now be found in the care plan section)  Progress towards OT goals: Progressing toward goals  Acute Rehab OT Goals Patient Stated Goal: to return home OT Goal Formulation: With patient Time For Goal Achievement: 11/23/19  Plan Discharge plan remains appropriate       AM-PAC OT "6 Clicks" Daily Activity     Outcome Measure   Help from another person eating meals?: None Help from another person taking care of personal grooming?: None Help from another person toileting, which includes using toliet, bedpan, or urinal?: None Help from another person bathing (including washing, rinsing, drying)?: None Help from another person to put on and taking off regular upper body clothing?: None Help from another person to put on and taking off regular lower body clothing?: None 6 Click Score: 24    End of Session    OT Visit Diagnosis: Other (comment) (cardiopulmonary status)   Activity  Tolerance Patient tolerated treatment well   Patient Left with call bell/phone within reach;with family/visitor present   Nurse Communication Mobility status        Time: 8599-2341 OT Time Calculation (min): 23 min  Charges: OT General Charges $OT Visit: 1 Visit OT  Treatments $Self Care/Home Management : 23-37 mins  Darleen Crocker, MS, OTR/L , CBIS ascom [phone]  11/11/19, 1:25 PM

## 2019-11-11 NOTE — Progress Notes (Signed)
PROGRESS NOTE    Austin Kim Kim  IPJ:[id number] DOB: 05/30/1955 DOA: 11/06/2019 PCP: Juluis Pitch, MD  Brief Narrative:  Austin Kim is a 64 y.o. male  with medical history significant foremphysema not requiring home O2, former smoker (quit smoking cigarettes in 2002), remote history of spontaneous pneumothorax requiring pleurodesis,hypertension, and hyperlipidemia.  He presented to the hospital because of shortness of breath that is worse with exertion, nonproductive cough, generalized weakness and fatigue. His oxygen saturation was 88% on room air and went down to 81% with ambulation.  CTA chest showed persistent chronic fibrotic changes and progressive acute on chronic infiltration bilateral lower lobes suggestive of atypical pneumonia.  He was admitted to the hospital for pneumonia, COPD exacerbation and acute hypoxemic respiratory failure.  He was treated with IV antibiotics, steroids and bronchodilators.  He remained fairly symptomatic and hypoxic despite the above interventions.  Pulmonary consultation requested.  Case discussed with Dr. Lanney Gins.  Fibrotic lung disease of unclear etiology.  Will need more extensive outpatient work-up.  Appearing to slowly respond to therapy.  Will start on Lasix per pulmonary and has some improvement in his respiratory symptoms.  Still desaturates with minimal exertion however.  Assessment & Plan:   Principal Problem:   ILD (interstitial lung disease) ([place]) Active Problems:   Hypertension   Acute respiratory failure with hypoxia (HCC)   Hyponatremia   Atypical pneumonia   COPD with acute exacerbation (HCC)   Malnutrition of moderate degree  Acute hypoxemic respiratory failure COPD with acute exacerbation Suspected interstitial lung disease Possible atypical pneumonia Patient has had known COPD for several years He was previously established with Dr. Raul Del from [facility] pulmonology He has not seen him in several  years as his respiratory status was stable Suspect this presentation represents an exacerbation of underlying ILD with concomitant COPD -Pulmonary consultation requested.  Case discussed with pulmonary consultant.  Suspect underlying fibrotic lung disease of unclear etiology.  Patient will need more extensive outpatient diagnostic evaluation.  Case discussed with Dr. Doran Stabler. Plan: Continue oxygen, wean as tolerated Home oxygen ordered Continue Solu-Medrol, dose decreased to once daily.  Can transition to p.o. prednisone on discharge Continue Lasix, dose decreased to once daily Hycodan and Tessalon for cough suppression MetaNebs every 6 hours per pulmonary consultant Outpatient pulmonary follow-up for further diagnostic evaluation  Hypertension Currently reasonably well controlled Continue current antihypertensives  Hyponatremia SIADH versus volume depletion Mild Daily BMP   DVT prophylaxis: Lovenox Code Status: Full Family Communication: Wife at bedside  disposition Plan: Status is: Inpatient  Remains inpatient appropriate because:Inpatient level of care appropriate due to severity of illness   Dispo: The patient is from: Home              Anticipated d/c is to: Home              Anticipated d/c date is: 1 day              Patient currently is not medically stable to d/c.   Patient appears to be slowly improving though remains hypoxic and symptomatic.  Still with significant cough.  Anticipate 1 additional day of inpatient treatment and monitoring.  Tentative plan to discharge home on 11/12/2019.   Consultants:   None  Procedures:   None  Antimicrobials:  Azithromycin  Ceftriaxone   Subjective: Seen and examined.  Still with cough and shortness of breath improving over interval  Objective: Vitals:   11/11/19 0507 11/11/19 0701 11/11/19 0749 11/11/19 1255  BP: 131/75   126/70  Pulse: 86  91 95  Resp: 20  18 16   Temp: 97.7 F (36.5 C)   98 F (36.7 C)    TempSrc: Oral   Oral  SpO2: 95% 94% 92% 96%  Weight:      Height:        Intake/Output Summary (Last 24 hours) at 11/11/2019 1337 Last data filed at 11/11/2019 0930 Gross per 24 hour  Intake 600 ml  Output 1875 ml  Net -1275 ml   Filed Weights   11/06/19 1038  Weight: 72.6 kg    Examination:  General exam: Appears calm and comfortable  Respiratory system: Coarse crackles bilaterally.  Normal work of breathing.  4 L nasal cannula. cardiovascular system: S1 & S2 heard, RRR. No JVD, murmurs, rubs, gallops or clicks. No pedal edema. Gastrointestinal system: Abdomen is nondistended, soft and nontender. No organomegaly or masses felt. Normal bowel sounds heard. Central nervous system: Alert and oriented. No focal neurological deficits. Extremities: Symmetric 5 x 5 power. Skin: No rashes, lesions or ulcers Psychiatry: Judgement and insight appear normal. Mood & affect appropriate.     Data Reviewed: I have personally reviewed following labs and imaging studies  CBC: Recent Labs  Lab 11/06/19 1042 11/07/19 0654 11/11/19 0852  WBC 13.8* 8.4 13.3*  NEUTROABS  --   --  10.3*  HGB 13.3 13.0 14.3  HCT 38.9* 37.0* 42.1  MCV 91.1 88.5 90.9  PLT 340 325 124   Basic Metabolic Panel: Recent Labs  Lab 11/06/19 1042 11/07/19 0654 11/08/19 1025 11/11/19 0852  NA 128* 133* 133* 133*  K 4.1 4.5 4.3 4.2  CL 94* 98 96* 95*  CO2 24 25 28 27   GLUCOSE 136* 146* 147* 142*  BUN 8 10 17 19   CREATININE 0.77 0.79 0.74 0.85  CALCIUM 8.5* 8.6* 8.8* 8.7*   GFR: Estimated Creatinine Clearance: 90.1 mL/min (by C-G formula based on SCr of 0.85 mg/dL). Liver Function Tests: Recent Labs  Lab 11/06/19 1042  AST 26  ALT 16  ALKPHOS 75  BILITOT 1.1  PROT 7.2  ALBUMIN 3.4*   No results for input(s): LIPASE, AMYLASE in the last 168 hours. No results for input(s): AMMONIA in the last 168 hours. Coagulation Profile: No results for input(s): INR, PROTIME in the last 168 hours. Cardiac  Enzymes: No results for input(s): CKTOTAL, CKMB, CKMBINDEX, TROPONINI in the last 168 hours. BNP (last 3 results) No results for input(s): PROBNP in the last 8760 hours. HbA1C: No results for input(s): HGBA1C in the last 72 hours. CBG: No results for input(s): GLUCAP in the last 168 hours. Lipid Profile: No results for input(s): CHOL, HDL, LDLCALC, TRIG, CHOLHDL, LDLDIRECT in the last 72 hours. Thyroid Function Tests: No results for input(s): TSH, T4TOTAL, FREET4, T3FREE, THYROIDAB in the last 72 hours. Anemia Panel: No results for input(s): VITAMINB12, FOLATE, FERRITIN, TIBC, IRON, RETICCTPCT in the last 72 hours. Sepsis Labs: Recent Labs  Lab 11/06/19 1729  PROCALCITON 0.12    Recent Results (from the past 240 hour(s))  Resp Panel by RT PCR (RSV, Flu A&B, Covid) - Nasopharyngeal Swab     Status: None   Collection Time: 11/06/19  5:29 PM   Specimen: Nasopharyngeal Swab  Result Value Ref Range Status   SARS Coronavirus 2 by RT PCR NEGATIVE NEGATIVE Final    Comment: (NOTE) SARS-CoV-2 target nucleic acids are NOT DETECTED.  The SARS-CoV-2 RNA is generally detectable in upper respiratoy specimens during the acute phase of  infection. The lowest concentration of SARS-CoV-2 viral copies this assay can detect is 131 copies/mL. A negative result does not preclude SARS-Cov-2 infection and should not be used as the sole basis for treatment or other patient management decisions. A negative result may occur with  improper specimen collection/handling, submission of specimen other than nasopharyngeal swab, presence of viral mutation(s) within the areas targeted by this assay, and inadequate number of viral copies (<131 copies/mL). A negative result must be combined with clinical observations, patient history, and epidemiological information. The expected result is Negative.  Fact Sheet for Patients:  PinkCheek.be  Fact Sheet for Healthcare Providers:    GravelBags.it  This test is no t yet approved or cleared by the [place] FDA and  has been authorized for detection and/or diagnosis of SARS-CoV-2 by FDA under an Emergency Use Authorization (EUA). This EUA will remain  in effect (meaning this test can be used) for the duration of the COVID-19 declaration under Section 564(b)(1) of the Act, 21 U.S.C. section 360bbb-3(b)(1), unless the authorization is terminated or revoked sooner.     Influenza A by PCR NEGATIVE NEGATIVE Final   Influenza B by PCR NEGATIVE NEGATIVE Final    Comment: (NOTE) The Xpert Xpress SARS-CoV-2/FLU/RSV assay is intended as an aid in  the diagnosis of influenza from Nasopharyngeal swab specimens and  should not be used as a sole basis for treatment. Nasal washings and  aspirates are unacceptable for Xpert Xpress SARS-CoV-2/FLU/RSV  testing.  Fact Sheet for Patients: PinkCheek.be  Fact Sheet for Healthcare Providers: GravelBags.it  This test is not yet approved or cleared by the [place] FDA and  has been authorized for detection and/or diagnosis of SARS-CoV-2 by  FDA under an Emergency Use Authorization (EUA). This EUA will remain  in effect (meaning this test can be used) for the duration of the  Covid-19 declaration under Section 564(b)(1) of the Act, 21  U.S.C. section 360bbb-3(b)(1), unless the authorization is  terminated or revoked.    Respiratory Syncytial Virus by PCR NEGATIVE NEGATIVE Final    Comment: (NOTE) Fact Sheet for Patients: PinkCheek.be  Fact Sheet for Healthcare Providers: GravelBags.it  This test is not yet approved or cleared by the [place] FDA and  has been authorized for detection and/or diagnosis of SARS-CoV-2 by  FDA under an Emergency Use Authorization (EUA). This EUA will remain  in effect (meaning this test can be  used) for the duration of the  COVID-19 declaration under Section 564(b)(1) of the Act, 21 U.S.C.  section 360bbb-3(b)(1), unless the authorization is terminated or  revoked. Performed at [facility], [address] [place], [place] [address]   Respiratory Panel by PCR     Status: None   Collection Time: 11/10/19  1:17 PM   Specimen: Nasopharyngeal Swab; Respiratory  Result Value Ref Range Status   Adenovirus NOT DETECTED NOT DETECTED Final   Coronavirus 229E NOT DETECTED NOT DETECTED Final    Comment: (NOTE) The Coronavirus on the Respiratory Panel, DOES NOT test for the novel  Coronavirus (2019 nCoV)    Coronavirus HKU1 NOT DETECTED NOT DETECTED Final   Coronavirus NL63 NOT DETECTED NOT DETECTED Final   Coronavirus OC43 NOT DETECTED NOT DETECTED Final   Metapneumovirus NOT DETECTED NOT DETECTED Final   Rhinovirus / Enterovirus NOT DETECTED NOT DETECTED Final   Influenza A NOT DETECTED NOT DETECTED Final   Influenza B NOT DETECTED NOT DETECTED Final   Parainfluenza Virus 1 NOT DETECTED NOT DETECTED Final  Parainfluenza Virus 2 NOT DETECTED NOT DETECTED Final   Parainfluenza Virus 3 NOT DETECTED NOT DETECTED Final   Parainfluenza Virus 4 NOT DETECTED NOT DETECTED Final   Respiratory Syncytial Virus NOT DETECTED NOT DETECTED Final   Bordetella pertussis NOT DETECTED NOT DETECTED Final   Chlamydophila pneumoniae NOT DETECTED NOT DETECTED Final   Mycoplasma pneumoniae NOT DETECTED NOT DETECTED Final    Comment: Performed at [facility], [place] [address], [place], [place] [address]         Radiology Studies: No results found.      Scheduled Meds: . amLODipine  5 mg Oral 1 day or 1 dose  . benzonatate  200 mg Oral TID  . enoxaparin (LOVENOX) injection  40 mg Subcutaneous Q24H  . feeding supplement (ENSURE ENLIVE)  237 mL Oral BID BM  . fluticasone  1 spray Each Nare Daily  . [START ON 11/12/2019] furosemide  40 mg Intravenous Daily  .  ipratropium-albuterol  3 mL Nebulization BID  . [START ON 11/12/2019] methylPREDNISolone (SOLU-MEDROL) injection  40 mg Intravenous Daily  . metoprolol tartrate  50 mg Oral BID   Continuous Infusions:   LOS: 4 days    Time spent: 25 minutes    Sidney Ace, MD [facility] Pager [phone]  If 7PM-7AM, please contact night-coverage 11/11/2019, 1:37 PM

## 2019-11-11 NOTE — Progress Notes (Signed)
PT Cancellation Note  Patient Details Name: Austin Kim MRN: [id number] DOB: April 07, 1955   Cancelled Treatment:    Reason Eval/Treat Not Completed: Other (comment). New order received. Discussed with attending in IPR. No new needs for acute PT evaluation. Cleared to dc in house. Will complete current order. Please re-order if needed.   Oralee Rapaport 11/11/2019, 11:55 AM  Greggory Stallion, PT, DPT [phone]

## 2019-11-12 ENCOUNTER — Inpatient Hospital Stay: Payer: BC Managed Care – PPO

## 2019-11-12 LAB — FUNGITELL, SERUM: Fungitell Result: <31 pg/mL (ref <80)

## 2019-11-12 MED ORDER — PREDNISONE 20 MG PO TABS
50.0000 mg | ORAL_TABLET | Freq: Every day | ORAL | Status: DC
  Administered 2019-11-12: 50 mg via ORAL
  Filled 2019-11-12: qty 3

## 2019-11-12 MED ORDER — AMOXICILLIN-POT CLAVULANATE 875-125 MG PO TABS
1.0000 | ORAL_TABLET | Freq: Two times a day (BID) | ORAL | Status: DC
  Administered 2019-11-12 – 2019-11-13 (×3): 1 via ORAL
  Filled 2019-11-12 (×3): qty 1

## 2019-11-12 MED ORDER — ALPRAZOLAM 0.25 MG PO TABS
0.2500 mg | ORAL_TABLET | Freq: Two times a day (BID) | ORAL | Status: DC | PRN
  Administered 2019-11-12 – 2019-11-13 (×2): 0.25 mg via ORAL
  Filled 2019-11-12 (×2): qty 1

## 2019-11-12 NOTE — Progress Notes (Addendum)
Mobility Specialist - Progress Note   11/12/19 1426  Mobility  Activity Ambulated in room;Ambulated in hall  Level of Assistance Modified independent, requires aide device or extra time  Assistive Device Other (Comment) (pushed portable O2 tank)  Distance Ambulated (ft) 425 ft  Mobility Response Tolerated well  Mobility performed by Mobility specialist  $Mobility charge 1 Mobility    Pre-mobility: 95 HR, 134/80 BP, 94% SpO2 During mobility: 106 HR, 91% SpO2 Post-mobility: 107 HR, 93% SpO2  Pt laying in bed upon arrival. Pt agreed to session. Pt's wife entered the room. Pt independent w/ bed mobility and S2S. Pt ambulated ~425' total in room and hallway mod. Independently on 4L O2 [place]. No LOB noted. Pt was SOB t/o session, but was managed through PLB. Pt's O2 desat no less than 91%. Overall, pt tolerated session well. Dr entered the room at the end of session. Pt left sitting EOB on 4L O2 [place], w/ wife and Dr present in room. Nurse was notified.     Austin Kim Mobility Specialist  11/12/19, 2:35 PM

## 2019-11-12 NOTE — TOC Progression Note (Signed)
Transition of Care [facility]) - Progression Note    Patient Details  Name: Austin Kim MRN: [id number] Date of Birth: [birth year]  Transition of Care [facility]) CM/SW Contact  Meriel Flavors, LCSW Phone Number: 11/12/2019, 12:39 PM  Clinical Narrative:    CSW has made referral for HH/PT and [facility] has accepted         Expected Discharge Plan and Services                                                 Social Determinants of Health (SDOH) Interventions    Readmission Risk Interventions No flowsheet data found.

## 2019-11-12 NOTE — Progress Notes (Signed)
Pulmonary Medicine          Date: 11/12/2019,   MRN# [id number] Austin Kim 1956/02/16     AdmissionWeight: 72.6 kg                 CurrentWeight: 72.6 kg   Referring physician: Dr Priscella Mann   CHIEF COMPLAINT:   Increased O2 requirement with interstitial lung disease   HISTORY OF PRESENT ILLNESS    This is a 64yo M hx of emphysema with COPD, chronic hypoxemia, hx of secondary spontaneous pneumothorax s/p pleurodesis who came in due to acute on chornic hypoxemic respiratory failure with respiratory distress. He reports worsening respiratory status over past 1 month and decreased activity from baseline. He also admits to cough which is worse then his usual cough. On arrival his SpO2 was 81%. He had blood work done on arrival with leukocytosis and CXR done in ED showed worsening infiltrates worse at bases bilaterally. He had CTPE protocol done which was negative for PE but did have acute on chronic GGO opacities at bases suggestive of pneumonia. Patient was at beach with wife last month and had covid testing prior to trip,he is vaccinated against covidx2 and had PPSV23 shot for strep pneumoniae.  Patient was negative for COVID flu and RSV on arrival. Pulmonary consultation placed due to persistent cough with worsening symptoms in patient who has complex pulmonary history and is relatively immunosuppresed due to chronic systemic steroid therapy.   11/11/19- patient is improved , >1.5L UOP overnight. Lung auscultation is improved. PT/OT again today. Lasix decreased to once daily today.  Wbc count trend up after steroids, have decrased solumedrol to 40 IV daily from BID. We discussed potential for d/c planning due to being on room air now, although he does desaturate with exertion.    9/25- patient is nervous about leaving home, I have offered case managemetn meeting to offer additional resources.  D/cd lasix , changed steroids to prednisone can taper by 10mg  daily starting with  50 today. Augmentin x 5 d more.  PAST MEDICAL HISTORY   Past Medical History:  Diagnosis Date   Blindness of right eye    COPD (chronic obstructive pulmonary disease) ([place])    Elevated lipids    Hx of basal cell carcinoma 02/05/2010   L temple   Hx of dysplastic nevus 04/12/2019   R spinal mid back, moderate to severe atypia, excised 05/09/2019   Hypertension    Spontaneous pneumothorax    Squamous cell carcinoma of skin 04/12/2019   Posterior Crown, SCC IS     SURGICAL HISTORY   Past Surgical History:  Procedure Laterality Date   BASAL CELL CARCINOMA EXCISION Bilateral    COLONOSCOPY WITH PROPOFOL N/A 07/28/2017   Procedure: COLONOSCOPY WITH PROPOFOL;  Surgeon: Lollie Sails, MD;  Location: [facility] ENDOSCOPY;  Service: Endoscopy;  Laterality: N/A;   EYE SURGERY     PLEURAL SCARIFICATION       FAMILY HISTORY   Family History  Problem Relation Age of Onset   Diabetes Father    CAD Father    Colon cancer Father    Heart disease Father        Heart transplant   Diabetes Brother        Type 1 DM     SOCIAL HISTORY   Social History   Tobacco Use   Smoking status: Former Smoker    Quit date: 2001    Years since quitting: 20.7   Smokeless  tobacco: Never Used   Tobacco comment: 2 PPD for ~20 years  Vaping Use   Vaping Use: Never used  Substance Use Topics   Alcohol use: Yes   Drug use: No     MEDICATIONS    Home Medication:    Current Medication:  Current Facility-Administered Medications:    albuterol (PROVENTIL) (2.5 MG/3ML) 0.083% nebulizer solution 2.5 mg, 2.5 mg, Nebulization, Q4H PRN, Sreenath, Sudheer B, MD   amLODipine (NORVASC) tablet 5 mg, 5 mg, Oral, 1 day or 1 dose, Patel, Roxanne Mins R, MD, 5 mg at 11/11/19 2244   benzonatate (TESSALON) capsule 200 mg, 200 mg, Oral, TID, Sreenath, Sudheer B, MD, 200 mg at 11/12/19 0910   enoxaparin (LOVENOX) injection 40 mg, 40 mg, Subcutaneous, Q24H, Patel, Vishal R, MD, 40 mg at  11/11/19 2246   feeding supplement (ENSURE ENLIVE) (ENSURE ENLIVE) liquid 237 mL, 237 mL, Oral, BID BM, Sreenath, Sudheer B, MD, 237 mL at 11/11/19 1037   fluticasone (FLONASE) 50 MCG/ACT nasal spray 1 spray, 1 spray, Each Nare, Daily, Sreenath, Sudheer B, MD, 1 spray at 11/12/19 0911   guaiFENesin (ROBITUSSIN) 100 MG/5ML solution 100 mg, 5 mL, Oral, Q4H PRN, Jennye Boroughs, MD, 100 mg at 11/08/19 1424   HYDROcodone-homatropine (HYCODAN) 5-1.5 MG/5ML syrup 5 mL, 5 mL, Oral, Q6H PRN, Jennye Boroughs, MD, 5 mL at 11/09/19 2210   influenza vac split quadrivalent PF (FLUARIX) injection 0.5 mL, 0.5 mL, Intramuscular, Prior to discharge, Lenore Cordia, MD   metoprolol tartrate (LOPRESSOR) tablet 50 mg, 50 mg, Oral, BID, Zada Finders R, MD, 50 mg at 11/12/19 7341   predniSONE (DELTASONE) tablet 50 mg, 50 mg, Oral, Q breakfast, Lanney Gins, Kiante Ciavarella, MD, 50 mg at 11/12/19 0910   sodium chloride (OCEAN) 0.65 % nasal spray 1 spray, 1 spray, Each Nare, PRN, Priscella Mann, Sudheer B, MD    ALLERGIES   Patient has no known allergies.     REVIEW OF SYSTEMS    Review of Systems:  Gen:  Denies  fever, sweats, chills weigh loss  HEENT: Denies blurred vision, double vision, ear pain, eye pain, hearing loss, nose bleeds, sore throat Cardiac:  No dizziness, chest pain or heaviness, chest tightness,edema Resp:   Denies cough or sputum porduction, shortness of breath,wheezing, hemoptysis,  Gi: Denies swallowing difficulty, stomach pain, nausea or vomiting, diarrhea, constipation, bowel incontinence Gu:  Denies bladder incontinence, burning urine Ext:   Denies Joint pain, stiffness or swelling Skin: Denies  skin rash, easy bruising or bleeding or hives Endoc:  Denies polyuria, polydipsia , polyphagia or weight change Psych:   Denies depression, insomnia or hallucinations   Other:  All other systems negative   VS: BP 119/70 (BP Location: Left Arm)    Pulse (!) 104    Temp 98.1 F (36.7 C) (Oral)     Resp 20    Ht 5\' 9"  (1.753 m)    Wt 72.6 kg    SpO2 91%    BMI 23.63 kg/m      PHYSICAL EXAM    GENERAL:NAD, no fevers, chills, no weakness no fatigue HEAD: Normocephalic, atraumatic.  EYES: Pupils equal, round, reactive to light. Extraocular muscles intact. No scleral icterus.  MOUTH: Moist mucosal membrane. Dentition intact. No abscess noted.  EAR, NOSE, THROAT: Clear without exudates. No external lesions.  NECK: Supple. No thyromegaly. No nodules. No JVD.  PULMONARY: velcro crepitations noted.  CARDIOVASCULAR: S1 and S2. Regular rate and rhythm. No murmurs, rubs, or gallops. No edema. Pedal pulses 2+ bilaterally.  GASTROINTESTINAL: Soft, nontender, nondistended. No masses. Positive bowel sounds. No hepatosplenomegaly.  MUSCULOSKELETAL: No swelling, clubbing, or edema. Range of motion full in all extremities.  NEUROLOGIC: Cranial nerves II through XII are intact. No gross focal neurological deficits. Sensation intact. Reflexes intact.  SKIN: No ulceration, lesions, rashes, or cyanosis. Skin warm and dry. Turgor intact.  PSYCHIATRIC: Mood, affect within normal limits. The patient is awake, alert and oriented x 3. Insight, judgment intact.       IMAGING    DG Chest 2 View  Result Date: 11/06/2019 CLINICAL DATA:  Hypoxia EXAM: CHEST - 2 VIEW COMPARISON:  January 18, 2018 FINDINGS: The cardiomediastinal silhouette is unchanged in contour.There are diffuse coarse interstitial opacities which have increased in comparison to prior in 2019. These are predominantly increased in the bases. Trace fluid in the RIGHT fissure. Biapical pleuroparenchymal scarring, unchanged. No pleural effusion. No pneumothorax. Visualized abdomen is unremarkable. Wedging of an upper upper lumbar vertebral body, unchanged. Atherosclerotic calcifications of the aorta. IMPRESSION: Diffuse coarse interstitial opacities, predominantly increased in the lung bases. Findings may reflect worsened underlying pulmonary  fibrosis versus superimposed infection or edema on a background of pulmonary fibrosis. Electronically Signed   By: Valentino Saxon MD   On: 11/06/2019 16:55   CT Angio Chest PE W and/or Wo Contrast  Result Date: 11/06/2019 CLINICAL DATA:  Worsening shortness of breath EXAM: CT ANGIOGRAPHY CHEST WITH CONTRAST TECHNIQUE: Multidetector CT imaging of the chest was performed using the standard protocol during bolus administration of intravenous contrast. Multiplanar CT image reconstructions and MIPs were obtained to evaluate the vascular anatomy. CONTRAST:  7mL OMNIPAQUE IOHEXOL 350 MG/ML SOLN COMPARISON:  CT from 01/18/2018, chest x-ray from earlier in the same day. FINDINGS: Cardiovascular: Thoracic aorta demonstrates mild atherosclerotic calcifications without aneurysmal dilatation or dissection. No significant cardiac enlargement is seen. Coronary calcifications are noted. The pulmonary artery shows a normal branching pattern without evidence of pulmonary embolism. Mediastinum/Nodes: Thoracic inlet is within normal limits. Scattered small mediastinal and hilar lymph nodes are noted increased from the prior exam but likely of a reactive nature given the changes in lung parenchyma. The esophagus as visualized is within normal limits. Lungs/Pleura: Chronic emphysematous and fibrotic changes are noted within the lungs bilaterally similar to that seen on prior CT examination. Additionally there are patchy predominately ground-glass opacities identified throughout both lung bases and to a lesser degree in the upper lobes bilaterally new from the prior exam consistent with an acute atypical pneumonia superimposed over the chronic fibrotic changes. No sizable effusion is seen. Previously seen Peri fissural nodularity in the left lower lobe is again identified but slightly less prominent consistent with a benign etiology likely related to scarring. No new sizable parenchymal nodules are seen. Upper Abdomen: Visualized  upper abdomen demonstrates a small cyst within the liver stable from the prior exam. Mild adrenal thickening is again noted and stable. No acute abnormality in the upper abdomen is seen. Musculoskeletal: Degenerative changes of the thoracic spine are noted. Review of the MIP images confirms the above findings. IMPRESSION: Progressive acute on chronic infiltrate in the lower lobes bilaterally consistent with atypical pneumonia. No evidence of pulmonary emboli. Persistent chronic fibrotic changes similar to that seen on the prior exam. Aortic Atherosclerosis (ICD10-I70.0) and Emphysema (ICD10-J43.9). Electronically Signed   By: Inez Catalina M.D.   On: 11/06/2019 19:24   DG Chest Port 1 View  Result Date: 11/11/2019 CLINICAL DATA:  Shortness of breath EXAM: PORTABLE CHEST 1 VIEW COMPARISON:  10/27/2019 FINDINGS: Diffuse  interstitial prominence. Patchy bilateral airspace disease. No real change since prior study. Heart is borderline in size. No effusions or acute bony abnormality. IMPRESSION: Diffuse interstitial prominence with coarsened bilateral lower lobe airspace opacities, similar to prior study. This may reflect chronic lung disease/fibrosis. It is difficult to completely exclude superimposed acute process. Electronically Signed   By: Rolm Baptise M.D.   On: 11/11/2019 10:21   ECHOCARDIOGRAM COMPLETE  Result Date: 11/08/2019    ECHOCARDIOGRAM REPORT   Patient Name:   SINAN TUCH Kim Date of Exam: 11/07/2019 Medical Rec #:  [id number]          Height:       69.0 in Accession #:    [id number]         Weight:       160.0 lb Date of Birth:  Jan 21, 1956          BSA:          1.879 m Patient Age:    40 years           BP:           138/89 mmHg Patient Gender: M                  HR:           99 bpm. Exam Location:  [facility] Procedure: 2D Echo Indications:     DYSPNEA 786.09/R06.00  History:         Patient has no prior history of Echocardiogram examinations.                  Risk Factors:Hypertension.   Sonographer:     Avanell Shackleton Referring Phys:  [id number] Cleaster Corin PATEL Diagnosing Phys: Yolonda Kida MD IMPRESSIONS  1. Bordrline inferior hypo.  2. Left ventricular ejection fraction, by estimation, is 60 to 65%. The left ventricle has normal function. The left ventricle demonstrates regional wall motion abnormalities (see scoring diagram/findings for description). Left ventricular diastolic parameters were normal.  3. Right ventricular systolic function is normal. The right ventricular size is normal. There is normal pulmonary artery systolic pressure.  4. The mitral valve is normal in structure. Trivial mitral valve regurgitation.  5. The aortic valve is normal in structure. Aortic valve regurgitation is not visualized. FINDINGS  Left Ventricle: Left ventricular ejection fraction, by estimation, is 60 to 65%. The left ventricle has normal function. The left ventricle demonstrates regional wall motion abnormalities. The left ventricular internal cavity size was normal in size. There is no left ventricular hypertrophy. Left ventricular diastolic parameters were normal.  LV Wall Scoring: The basal inferolateral segment and basal inferior segment are hypokinetic. Right Ventricle: The right ventricular size is normal. No increase in right ventricular wall thickness. Right ventricular systolic function is normal. There is normal pulmonary artery systolic pressure. Left Atrium: Left atrial size was normal in size. Right Atrium: Right atrial size was normal in size. Pericardium: There is no evidence of pericardial effusion. Mitral Valve: The mitral valve is normal in structure. Trivial mitral valve regurgitation. Tricuspid Valve: The tricuspid valve is normal in structure. Tricuspid valve regurgitation is trivial. Aortic Valve: The aortic valve is normal in structure. Aortic valve regurgitation is not visualized. Pulmonic Valve: The pulmonic valve was grossly normal. Pulmonic valve regurgitation is not visualized.  Aorta: The aortic root is normal in size and structure. IAS/Shunts: No atrial level shunt detected by color flow Doppler. Additional Comments: Bordrline inferior hypo.  LEFT VENTRICLE PLAX 2D LVIDd:  4.57 cm  Diastology LVIDs:         2.82 cm  LV e' medial:   9.03 cm/s LV PW:         0.76 cm  LV E/e' medial: 9.8 LV IVS:        0.83 cm LVOT diam:     1.80 cm LVOT Area:     2.54 cm  RIGHT VENTRICLE             IVC RV S prime:     14.80 cm/s  IVC diam: 1.85 cm TAPSE (M-mode): 3.4 cm LEFT ATRIUM           Index       RIGHT ATRIUM           Index LA diam:      4.00 cm 2.13 cm/m  RA Area:     18.10 cm LA Vol (A4C): 50.8 ml 27.03 ml/m RA Volume:   49.40 ml  26.29 ml/m   AORTA Ao Root diam: 3.50 cm MITRAL VALVE MV Area (PHT): 4.06 cm    SHUNTS MV Decel Time: 187 msec    Systemic Diam: 1.80 cm MV E velocity: 88.80 cm/s MV A velocity: 86.30 cm/s MV E/A ratio:  1.03 Dwayne D Callwood MD Electronically signed by Yolonda Kida MD Signature Date/Time: 11/08/2019/7:53:52 AM    Final        ASSESSMENT/PLAN    Acute on chronic hypoxemic respiratory failure   - patient has combined pulmonary fibrosis and emphysema (CPFE)   - he has pulmonary edema on CT chest along with chronic bullous emphysema and pulmonary fibrosis   - he had leukocytosis on admission indicative of acute infectious etiology - will add RVP to r/o other viral etiologies as well as walking pneumonia  - fungitell to evaluate for fungal involvement   -patient is on no inhalers at all.    -he has no oxygen at home.   -he will need full workup and therapy on outpatient.  -will need to be evaluated for ILD, alpha 1 antitrypsin def, patient has elevated rheumatoid factor but remainder of autoimmune workup is negative.    Thank you for allowing me to participate in the care of this patient.  .   Patient/Family are satisfied with care plan and all questions have been answered.  This document was prepared using Dragon voice recognition  software and may include unintentional dictation errors.     Ottie Glazier, M.D.  Division of [place]

## 2019-11-12 NOTE — Evaluation (Signed)
Physical Therapy Evaluation Patient Details Name: Austin Kim MRN: [id number] DOB: 15-Apr-1955 Today's Date: 11/12/2019   History of Present Illness  64 y.o. male  with medical history significant for emphysema not requiring home O2,  former smoker (quit smoking cigarettes in 2002), remote history of spontaneous pneumothorax requiring pleurodesis, hypertension, and hyperlipidemia.  He presented to the hospital because of shortness of breath that is worse with exertion, nonproductive cough, generalized weakness and fatigue.His oxygen saturation was 88% on room air and went down to 81% with ambulation.  CTA chest showed persistent chronic fibrotic changes and progressive acute on chronic infiltration bilateral lower lobes suggestive of atypical pneumonia.  Clinical Impression  Pt was seen for mobility and sat monitoring, and could even climb stair to manage entrance to his home.  Pt is motivated, and more comfortable today with sats 92% or greater.  He was interested in getting shower chair for home, and agreed to home therapy to address LE weakness on RLE.  Follow acutely as his admission permits for progression of his needs.    Follow Up Recommendations Home health PT    Equipment Recommendations  Other (comment) (shower chair)    Recommendations for Other Services       Precautions / Restrictions Precautions Precautions: Fall Precaution Comments: monitor O2 sats Restrictions Weight Bearing Restrictions: No Other Position/Activity Restrictions: walk with supplemental O2      Mobility  Bed Mobility Overal bed mobility: Modified Independent                Transfers Overall transfer level: Needs assistance Equipment used: None Transfers: Sit to/from Stand Sit to Stand: Supervision (for monitoring of sats)            Ambulation/Gait Ambulation/Gait assistance: Supervision Gait Distance (Feet): 250 Feet Assistive device: None Gait Pattern/deviations:  Step-through pattern;Decreased stride length;Wide base of support Gait velocity: reduced Gait velocity interpretation: <1.31 ft/sec, indicative of household ambulator General Gait Details: pt walks with a controlled pace and did not drop below 92% during entire trip  Stairs Stairs: Yes Stairs assistance: Min guard Stair Management: Two rails;Forwards;Alternating pattern Number of Stairs: 4 General stair comments: no LOB and no drop in O2 sats  Wheelchair Mobility    Modified Rankin (Stroke Patients Only)       Balance Overall balance assessment: Needs assistance Sitting-balance support: Feet supported Sitting balance-Leahy Scale: Good     Standing balance support: No upper extremity supported;During functional activity Standing balance-Leahy Scale: Good                               Pertinent Vitals/Pain Pain Assessment: No/denies pain    Home Living Family/patient expects to be discharged to:: Private residence Living Arrangements: Spouse/significant other Available Help at Discharge: Family;Available 24 hours/day Type of Home: House Home Access: Stairs to enter Entrance Stairs-Rails: Psychiatric nurse of Steps: 3 Home Layout: One level Home Equipment: None Additional Comments: asking for shower chair    Prior Function Level of Independence: Independent         Comments: independent in all mobility wiht no AD prev     Hand Dominance   Dominant Hand: Right    Extremity/Trunk Assessment   Upper Extremity Assessment Upper Extremity Assessment: Overall WFL for tasks assessed    Lower Extremity Assessment Lower Extremity Assessment: Generalized weakness    Cervical / Trunk Assessment Cervical / Trunk Assessment: Normal  Communication   Communication: No  difficulties  Cognition Arousal/Alertness: Awake/alert Behavior During Therapy: WFL for tasks assessed/performed Overall Cognitive Status: Within Functional Limits for  tasks assessed                                        General Comments General comments (skin integrity, edema, etc.): pt was ranging from 92 to 98% during entire session, using 4L as is on the wall currently    Exercises     Assessment/Plan    PT Assessment Patient needs continued PT services  PT Problem List Decreased strength;Decreased activity tolerance;Decreased balance;Decreased mobility;Decreased knowledge of use of DME;Decreased safety awareness;Cardiopulmonary status limiting activity       PT Treatment Interventions DME instruction;Gait training;Stair training;Functional mobility training;Therapeutic activities;Balance training;Therapeutic exercise;Neuromuscular re-education;Patient/family education    PT Goals (Current goals can be found in the Care Plan section)  Acute Rehab PT Goals Patient Stated Goal: to return home PT Goal Formulation: With patient/family Time For Goal Achievement: 11/19/19 Potential to Achieve Goals: Good    Frequency Min 2X/week   Barriers to discharge Inaccessible home environment stairs to enter house    Co-evaluation               AM-PAC PT "6 Clicks" Mobility  Outcome Measure Help needed turning from your back to your side while in a flat bed without using bedrails?: None Help needed moving from lying on your back to sitting on the side of a flat bed without using bedrails?: None Help needed moving to and from a bed to a chair (including a wheelchair)?: A Little Help needed standing up from a chair using your arms (e.g., wheelchair or bedside chair)?: A Little Help needed to walk in hospital room?: A Little Help needed climbing 3-5 steps with a railing? : A Little 6 Click Score: 20    End of Session Equipment Utilized During Treatment: Oxygen Activity Tolerance: Patient tolerated treatment well;Treatment limited secondary to medical complications (Comment) Patient left: in bed;with call bell/phone within  reach;with bed alarm set;with family/visitor present Nurse Communication: Mobility status PT Visit Diagnosis: Muscle weakness (generalized) (M62.81);Difficulty in walking, not elsewhere classified (R26.2)    Time: 1100-1134 PT Time Calculation (min) (ACUTE ONLY): 34 min   Charges:   PT Evaluation $PT Eval Moderate Complexity: 1 Mod PT Treatments $Gait Training: 8-22 mins       Ramond Dial 11/12/2019, 11:56 AM  Mee Hives, PT MS Acute Rehab Dept. Number: [place] [facility] [place]

## 2019-11-13 MED ORDER — METHOCARBAMOL 500 MG PO TABS
500.0000 mg | ORAL_TABLET | Freq: Once | ORAL | Status: AC
  Administered 2019-11-13: 500 mg via ORAL
  Filled 2019-11-13: qty 1

## 2019-11-13 MED ORDER — BENZONATATE 200 MG PO CAPS: 200.0000 mg | ORAL_CAPSULE | Freq: Three times a day (TID) | ORAL | 0 refills | Status: DC

## 2019-11-13 MED ORDER — PREDNISONE 10 MG PO TABS: ORAL_TABLET | ORAL | 0 refills | Status: AC

## 2019-11-13 MED ORDER — AMOXICILLIN-POT CLAVULANATE 875-125 MG PO TABS: 1.0000 | ORAL_TABLET | Freq: Two times a day (BID) | ORAL | 0 refills | Status: AC

## 2019-11-13 MED ORDER — ALPRAZOLAM 0.25 MG PO TABS: 0.2500 mg | ORAL_TABLET | Freq: Two times a day (BID) | ORAL | 0 refills | Status: AC | PRN

## 2019-11-13 MED ORDER — ALBUTEROL SULFATE HFA 108 (90 BASE) MCG/ACT IN AERS: 2.0000 | INHALATION_SPRAY | Freq: Four times a day (QID) | RESPIRATORY_TRACT | 2 refills | Status: AC | PRN

## 2019-11-13 MED ORDER — METHOCARBAMOL 500 MG PO TABS: 500.0000 mg | ORAL_TABLET | Freq: Three times a day (TID) | ORAL | 0 refills | Status: AC | PRN

## 2019-11-13 MED ORDER — PREDNISONE 20 MG PO TABS
40.0000 mg | ORAL_TABLET | Freq: Every day | ORAL | Status: DC
  Administered 2019-11-13: 40 mg via ORAL
  Filled 2019-11-13: qty 2

## 2019-11-13 NOTE — TOC Transition Note (Signed)
Transition of Care [facility]) - CM/SW Discharge Note   Patient Details  Name: KHADAR MONGER MRN: [id number] Date of Birth: 31-Mar-1955  Transition of Care [facility]) CM/SW Contact:  Trecia Rogers, LCSW Phone Number: 11/13/2019, 9:13 AM   Clinical Narrative:     Patient will be discharging home with home health services through [facility]. CSW contacted Tillie Rung at [facility] and confirmed additional orders that was added by doctor for PT, OP, RN, and nurse Aid. CSW contacted [facility] and added shower stool to existing oxygen DME. CSW made doctor and nurse aware. Doctor states that patient's wife will be transporting him home today for discharge.  No other needs.   Final next level of care: [place] Barriers to Discharge: No Barriers Identified   Patient Goals and CMS Choice     Choice offered to / list presented to : Patient  Discharge Placement                       Discharge Plan and Services                  DME Agency: [facility] Date DME Agency Contacted: 11/13/19 Time DME Agency Contacted: [phone] Representative spoke with at DME Agency: [place] Arranged: RN, PT, OT, Nurse's Aide [place] Agency: [facility] Date [facility] Agency Contacted: 11/13/19 Time [place]: [phone] Representative spoke with at [place]: [place] ([place]) Interventions     Readmission Risk Interventions No flowsheet data found.

## 2019-11-13 NOTE — Progress Notes (Signed)
Discharge order received. Patient mental status is at baseline. Vital signs stable . No signs of acute distress. Discharge instructions given. Patient verbalized understanding. No other issues noted at this time.   

## 2019-11-13 NOTE — Discharge Summary (Signed)
Physician Discharge Summary  RODARIUS KICHLINE Kim ZJQ:[id number] DOB: 05-08-1955 DOA: 11/06/2019  PCP: Juluis Pitch, MD  Admit date: 11/06/2019 Discharge date: 11/13/2019  Admitted From: Home Disposition:  Home  Recommendations for Outpatient Follow-up:  1. Follow up with PCP in 1-2 weeks 2. Follow-up with pulmonology in 1 week  Home Health: Yes Equipment/Devices: Oxygen, 4 L Discharge Condition: Stable CODE STATUS: Full Diet recommendation: Heart Healthy  Brief/Interim Summary: Austin HOWATT IIIis a 64 y.o.malewith medical history significant foremphysema not requiring home O2,former smoker (quit smoking cigarettes in 2002), remote history of spontaneous pneumothorax requiring pleurodesis,hypertension, and hyperlipidemia. He presented to the hospital because of shortness of breath that is worse with exertion, nonproductive cough, generalized weakness and fatigue. His oxygen saturation was 88% on room air and went down to 81% with ambulation. CTA chest showed persistent chronic fibrotic changes and progressive acute on chronic infiltration bilateral lower lobes suggestive of atypical pneumonia.  He was admitted to the hospital for pneumonia, COPD exacerbation and acute hypoxemic respiratory failure. He was treated with IV antibiotics, steroids and bronchodilators.  He remained fairly symptomatic and hypoxic despite the above interventions.  Pulmonary consultation requested.  Case discussed with Dr. Lanney Gins.  Fibrotic lung disease of unclear etiology.  Will need more extensive outpatient work-up.  Appearing to slowly respond to therapy.  Will start on Lasix per pulmonary and has some improvement in his respiratory symptoms.  Still desaturates with minimal exertion however.  Respiratory status improved at time of discharge.  Home health that and DME ordered.  Oxygen 4 L ordered delivered at bedside before discharge.  Will discharge on prednisone taper, antibiotics, Tessalon  Perles as needed.  Small amount of as needed Xanax provided on discharge.  Patient will follow with primary care within 1 week of discharge.  Also referred to pulmonary office to follow-up with Dr. Lanney Gins.  Discharge Diagnoses:  Principal Problem:   ILD (interstitial lung disease) ([place]) Active Problems:   Hypertension   Acute respiratory failure with hypoxia (HCC)   Hyponatremia   Atypical pneumonia   COPD with acute exacerbation (HCC)   Malnutrition of moderate degree  Acute hypoxemic respiratory failure COPD with acute exacerbation Suspected interstitial lung disease Possible atypical pneumonia Patient has had known COPD for several years He was previously established with Dr. Raul Del [facility] pul[facility]ology He has not seen him in several years as his respiratory status was stable Suspect this presentation represents an exacerbation of underlying ILD with concomitant COPD -Pulmonary consultation requested.  Case discussed with pulmonary consultant.  Suspect underlying fibrotic lung disease of unclear etiology.  Patient will need more extensive outpatient diagnostic evaluation.  Case discussed with Dr. Lanney Gins . Discharge recommendations: Home oxygen, 4 L Switch to oral prednisone on discharge.  Taper by 10 mg a day No Lasix on discharge Hycodan and Tessalon for cough suppression Augmentin x3 additional days  Hypertension Currently reasonably well controlled Continue current antihypertensives  Hyponatremia SIADH versus volume depletion Mild   Discharge Instructions  Discharge Instructions    Diet - low sodium heart healthy   Complete by: As directed    Increase activity slowly   Complete by: As directed      Allergies as of 11/13/2019   No Known Allergies     Medication List    TAKE these medications   albuterol 108 (90 Base) MCG/ACT inhaler Commonly known as: VENTOLIN HFA Inhale 2 puffs into the lungs every 6 (six) hours as needed for wheezing  or shortness of  breath.   ALPRAZolam 0.25 MG tablet Commonly known as: XANAX Take 1 tablet (0.25 mg total) by mouth 2 (two) times daily as needed for up to 5 days for anxiety.   amLODipine 5 MG tablet Commonly known as: NORVASC Take 1 tablet by mouth 1 day or 1 dose.   amoxicillin-clavulanate 875-125 MG tablet Commonly known as: AUGMENTIN Take 1 tablet by mouth 2 (two) times daily for 3 days.   benzonatate 200 MG capsule Commonly known as: TESSALON Take 1 capsule (200 mg total) by mouth 3 (three) times daily.   loratadine 10 MG tablet Commonly known as: CLARITIN Take 10 mg by mouth daily.   methocarbamol 500 MG tablet Commonly known as: Robaxin Take 1 tablet (500 mg total) by mouth every 8 (eight) hours as needed for up to 10 days for muscle spasms.   metoprolol tartrate 50 MG tablet Commonly known as: LOPRESSOR Take 50 mg by mouth 2 (two) times daily.   predniSONE 10 MG tablet Commonly known as: DELTASONE Take 3 tablets (30 mg total) by mouth daily with breakfast for 1 day, THEN 2 tablets (20 mg total) daily with breakfast for 1 day, THEN 1 tablet (10 mg total) daily with breakfast for 1 day. Start taking on: November 14, 2019            Durable Medical Equipment  (From admission, onward)         Start     Ordered   11/13/19 0721  For home use only DME oxygen  Once       Question Answer Comment  Length of Need 6 Months   Mode or (Route) Nasal cannula   Liters per Minute 4   Frequency Continuous (stationary and portable oxygen unit needed)   Oxygen conserving device Yes   Oxygen delivery system Gas      11/13/19 0720   11/12/19 1147  For home use only DME Shower stool  Once        11/12/19 1146          Follow-up Information    Ottie Glazier, MD. Schedule an appointment as soon as possible for a visit in 1 week(s).   Specialty: Pulmonary Disease Why: Call Dr. Teodoro Kil office for followup appt Contact information: [place] [address] [phone]        Juluis Pitch, MD. Schedule an appointment as soon as possible for a visit in 1 week(s).   Specialty: Family Medicine Contact information: [address] [address] [phone]              No Known Allergies  Consultations:  Pulmonology   Procedures/Studies: DG Chest 2 View  Result Date: 11/06/2019 CLINICAL DATA:  Hypoxia EXAM: CHEST - 2 VIEW COMPARISON:  January 18, 2018 FINDINGS: The cardiomediastinal silhouette is unchanged in contour.There are diffuse coarse interstitial opacities which have increased in comparison to prior in 2019. These are predominantly increased in the bases. Trace fluid in the RIGHT fissure. Biapical pleuroparenchymal scarring, unchanged. No pleural effusion. No pneumothorax. Visualized abdomen is unremarkable. Wedging of an upper upper lumbar vertebral body, unchanged. Atherosclerotic calcifications of the aorta. IMPRESSION: Diffuse coarse interstitial opacities, predominantly increased in the lung bases. Findings may reflect worsened underlying pulmonary fibrosis versus superimposed infection or edema on a background of pulmonary fibrosis. Electronically Signed   By: Valentino Saxon MD   On: 11/06/2019 16:55   CT Angio Chest PE W and/or Wo Contrast  Result Date: 11/06/2019 CLINICAL  DATA:  Worsening shortness of breath EXAM: CT ANGIOGRAPHY CHEST WITH CONTRAST TECHNIQUE: Multidetector CT imaging of the chest was performed using the standard protocol during bolus administration of intravenous contrast. Multiplanar CT image reconstructions and MIPs were obtained to evaluate the vascular anatomy. CONTRAST:  68mL OMNIPAQUE IOHEXOL 350 MG/ML SOLN COMPARISON:  CT from 01/18/2018, chest x-ray from earlier in the same day. FINDINGS: Cardiovascular: Thoracic aorta demonstrates mild atherosclerotic calcifications without aneurysmal dilatation or dissection. No significant cardiac enlargement is seen.  Coronary calcifications are noted. The pulmonary artery shows a normal branching pattern without evidence of pulmonary embolism. Mediastinum/Nodes: Thoracic inlet is within normal limits. Scattered small mediastinal and hilar lymph nodes are noted increased from the prior exam but likely of a reactive nature given the changes in lung parenchyma. The esophagus as visualized is within normal limits. Lungs/Pleura: Chronic emphysematous and fibrotic changes are noted within the lungs bilaterally similar to that seen on prior CT examination. Additionally there are patchy predominately ground-glass opacities identified throughout both lung bases and to a lesser degree in the upper lobes bilaterally new from the prior exam consistent with an acute atypical pneumonia superimposed over the chronic fibrotic changes. No sizable effusion is seen. Previously seen Peri fissural nodularity in the left lower lobe is again identified but slightly less prominent consistent with a benign etiology likely related to scarring. No new sizable parenchymal nodules are seen. Upper Abdomen: Visualized upper abdomen demonstrates a small cyst within the liver stable from the prior exam. Mild adrenal thickening is again noted and stable. No acute abnormality in the upper abdomen is seen. Musculoskeletal: Degenerative changes of the thoracic spine are noted. Review of the MIP images confirms the above findings. IMPRESSION: Progressive acute on chronic infiltrate in the lower lobes bilaterally consistent with atypical pneumonia. No evidence of pulmonary emboli. Persistent chronic fibrotic changes similar to that seen on the prior exam. Aortic Atherosclerosis (ICD10-I70.0) and Emphysema (ICD10-J43.9). Electronically Signed   By: Inez Catalina M.D.   On: 11/06/2019 19:24   DG Chest Port 1 View  Result Date: 11/12/2019 CLINICAL DATA:  Patient with history of pneumonia and COPD. EXAM: PORTABLE CHEST 1 VIEW COMPARISON:  Chest radiograph November 11, 2019. FINDINGS: Stable cardiac and mediastinal contours. Similar-appearing diffuse bilateral interstitial opacities. No definite pleural effusion or pneumothorax. IMPRESSION: Similar-appearing diffuse bilateral interstitial opacities which may represent acute process superimposed upon chronic changes. Electronically Signed   By: Lovey Newcomer M.D.   On: 11/12/2019 09:45   DG Chest Port 1 View  Result Date: 11/11/2019 CLINICAL DATA:  Shortness of breath EXAM: PORTABLE CHEST 1 VIEW COMPARISON:  10/27/2019 FINDINGS: Diffuse interstitial prominence. Patchy bilateral airspace disease. No real change since prior study. Heart is borderline in size. No effusions or acute bony abnormality. IMPRESSION: Diffuse interstitial prominence with coarsened bilateral lower lobe airspace opacities, similar to prior study. This may reflect chronic lung disease/fibrosis. It is difficult to completely exclude superimposed acute process. Electronically Signed   By: Rolm Baptise M.D.   On: 11/11/2019 10:21   ECHOCARDIOGRAM COMPLETE  Result Date: 11/08/2019    ECHOCARDIOGRAM REPORT   Patient Name:   CLEBURN MAIOLO Kim Date of Exam: 11/07/2019 Medical Rec #:  [id number]          Height:       69.0 in Accession #:    [id number]         Weight:       160.0 lb Date of Birth:  [birth year]  BSA:          1.879 m Patient Age:    45 years           BP:           138/89 mmHg Patient Gender: M                  HR:           99 bpm. Exam Location:  [facility] Procedure: 2D Echo Indications:     DYSPNEA 786.09/R06.00  History:         Patient has no prior history of Echocardiogram examinations.                  Risk Factors:Hypertension.  Sonographer:     Avanell Shackleton Referring Phys:  [id number] Cleaster Corin PATEL Diagnosing Phys: Yolonda Kida MD IMPRESSIONS  1. Bordrline inferior hypo.  2. Left ventricular ejection fraction, by estimation, is 60 to 65%. The left ventricle has normal function. The left ventricle demonstrates regional wall  motion abnormalities (see scoring diagram/findings for description). Left ventricular diastolic parameters were normal.  3. Right ventricular systolic function is normal. The right ventricular size is normal. There is normal pulmonary artery systolic pressure.  4. The mitral valve is normal in structure. Trivial mitral valve regurgitation.  5. The aortic valve is normal in structure. Aortic valve regurgitation is not visualized. FINDINGS  Left Ventricle: Left ventricular ejection fraction, by estimation, is 60 to 65%. The left ventricle has normal function. The left ventricle demonstrates regional wall motion abnormalities. The left ventricular internal cavity size was normal in size. There is no left ventricular hypertrophy. Left ventricular diastolic parameters were normal.  LV Wall Scoring: The basal inferolateral segment and basal inferior segment are hypokinetic. Right Ventricle: The right ventricular size is normal. No increase in right ventricular wall thickness. Right ventricular systolic function is normal. There is normal pulmonary artery systolic pressure. Left Atrium: Left atrial size was normal in size. Right Atrium: Right atrial size was normal in size. Pericardium: There is no evidence of pericardial effusion. Mitral Valve: The mitral valve is normal in structure. Trivial mitral valve regurgitation. Tricuspid Valve: The tricuspid valve is normal in structure. Tricuspid valve regurgitation is trivial. Aortic Valve: The aortic valve is normal in structure. Aortic valve regurgitation is not visualized. Pulmonic Valve: The pulmonic valve was grossly normal. Pulmonic valve regurgitation is not visualized. Aorta: The aortic root is normal in size and structure. IAS/Shunts: No atrial level shunt detected by color flow Doppler. Additional Comments: Bordrline inferior hypo.  LEFT VENTRICLE PLAX 2D LVIDd:         4.57 cm  Diastology LVIDs:         2.82 cm  LV e' medial:   9.03 cm/s LV PW:         0.76 cm  LV  E/e' medial: 9.8 LV IVS:        0.83 cm LVOT diam:     1.80 cm LVOT Area:     2.54 cm  RIGHT VENTRICLE             IVC RV S prime:     14.80 cm/s  IVC diam: 1.85 cm TAPSE (M-mode): 3.4 cm LEFT ATRIUM           Index       RIGHT ATRIUM           Index LA diam:      4.00 cm 2.13 cm/m  RA  Area:     18.10 cm LA Vol (A4C): 50.8 ml 27.03 ml/m RA Volume:   49.40 ml  26.29 ml/m   AORTA Ao Root diam: 3.50 cm MITRAL VALVE MV Area (PHT): 4.06 cm    SHUNTS MV Decel Time: 187 msec    Systemic Diam: 1.80 cm MV E velocity: 88.80 cm/s MV A velocity: 86.30 cm/s MV E/A ratio:  1.03 Dwayne D Callwood MD Electronically signed by Yolonda Kida MD Signature Date/Time: 11/08/2019/7:53:52 AM    Final     (Echo, Carotid, EGD, Colonoscopy, ERCP)    Subjective: Seen and examined the day of discharge.  Respiratory status improved.  Still with some cough.  Stable at rest however requires a submental oxygen with exertion.  Prescribed on discharge.  Stable for discharge home at this time.  Follow-up with PCP in 1 week.  Follow-up with pulmonary in 1 week.  Discharge Exam: Vitals:   11/12/19 2132 11/13/19 0510  BP: 125/73 117/71  Pulse: 82 77  Resp: 20 18  Temp: 98.2 F (36.8 C) 97.8 F (36.6 C)  SpO2: 96% 95%   Vitals:   11/12/19 2002 11/12/19 2022 11/12/19 2132 11/13/19 0510  BP:  119/71 125/73 117/71  Pulse:  83 82 77  Resp:  18 20 18   Temp:  97.9 F (36.6 C) 98.2 F (36.8 C) 97.8 F (36.6 C)  TempSrc:  Oral Oral Oral  SpO2: 98% 95% 96% 95%  Weight:      Height:        General: Pt is alert, awake, not in acute distress Cardiovascular: RRR, S1/S2 +, no rubs, no gallops Respiratory: Equal air entry bilaterally.  Coarse crackles bilaterally.  Normal work of breathing Abdominal: Soft, NT, ND, bowel sounds + Extremities: no edema, no cyanosis    The results of significant diagnostics from this hospitalization (including imaging, microbiology, ancillary and laboratory) are listed below for  reference.     Microbiology: Recent Results (from the past 240 hour(s))  Resp Panel by RT PCR (RSV, Flu A&B, Covid) - Nasopharyngeal Swab     Status: None   Collection Time: 11/06/19  5:29 PM   Specimen: Nasopharyngeal Swab  Result Value Ref Range Status   SARS Coronavirus 2 by RT PCR NEGATIVE NEGATIVE Final    Comment: (NOTE) SARS-CoV-2 target nucleic acids are NOT DETECTED.  The SARS-CoV-2 RNA is generally detectable in upper respiratoy specimens during the acute phase of infection. The lowest concentration of SARS-CoV-2 viral copies this assay can detect is 131 copies/mL. A negative result does not preclude SARS-Cov-2 infection and should not be used as the sole basis for treatment or other patient management decisions. A negative result may occur with  improper specimen collection/handling, submission of specimen other than nasopharyngeal swab, presence of viral mutation(s) within the areas targeted by this assay, and inadequate number of viral copies (<131 copies/mL). A negative result must be combined with clinical observations, patient history, and epidemiological information. The expected result is Negative.  Fact Sheet for Patients:  PinkCheek.be  Fact Sheet for Healthcare Providers:  GravelBags.it  This test is no t yet approved or cleared by the [place] FDA and  has been authorized for detection and/or diagnosis of SARS-CoV-2 by FDA under an Emergency Use Authorization (EUA). This EUA will remain  in effect (meaning this test can be used) for the duration of the COVID-19 declaration under Section 564(b)(1) of the Act, 21 U.S.C. section 360bbb-3(b)(1), unless the authorization is terminated or revoked sooner.  Influenza A by PCR NEGATIVE NEGATIVE Final   Influenza B by PCR NEGATIVE NEGATIVE Final    Comment: (NOTE) The Xpert Xpress SARS-CoV-2/FLU/RSV assay is intended as an aid in  the diagnosis  of influenza from Nasopharyngeal swab specimens and  should not be used as a sole basis for treatment. Nasal washings and  aspirates are unacceptable for Xpert Xpress SARS-CoV-2/FLU/RSV  testing.  Fact Sheet for Patients: PinkCheek.be  Fact Sheet for Healthcare Providers: GravelBags.it  This test is not yet approved or cleared by the [place] FDA and  has been authorized for detection and/or diagnosis of SARS-CoV-2 by  FDA under an Emergency Use Authorization (EUA). This EUA will remain  in effect (meaning this test can be used) for the duration of the  Covid-19 declaration under Section 564(b)(1) of the Act, 21  U.S.C. section 360bbb-3(b)(1), unless the authorization is  terminated or revoked.    Respiratory Syncytial Virus by PCR NEGATIVE NEGATIVE Final    Comment: (NOTE) Fact Sheet for Patients: PinkCheek.be  Fact Sheet for Healthcare Providers: GravelBags.it  This test is not yet approved or cleared by the [place] FDA and  has been authorized for detection and/or diagnosis of SARS-CoV-2 by  FDA under an Emergency Use Authorization (EUA). This EUA will remain  in effect (meaning this test can be used) for the duration of the  COVID-19 declaration under Section 564(b)(1) of the Act, 21 U.S.C.  section 360bbb-3(b)(1), unless the authorization is terminated or  revoked. Performed at [facility], [address] [place], [place] [address]   Respiratory Panel by PCR     Status: None   Collection Time: 11/10/19  1:17 PM   Specimen: Nasopharyngeal Swab; Respiratory  Result Value Ref Range Status   Adenovirus NOT DETECTED NOT DETECTED Final   Coronavirus 229E NOT DETECTED NOT DETECTED Final    Comment: (NOTE) The Coronavirus on the Respiratory Panel, DOES NOT test for the novel  Coronavirus (2019 nCoV)    Coronavirus HKU1 NOT DETECTED NOT  DETECTED Final   Coronavirus NL63 NOT DETECTED NOT DETECTED Final   Coronavirus OC43 NOT DETECTED NOT DETECTED Final   Metapneumovirus NOT DETECTED NOT DETECTED Final   Rhinovirus / Enterovirus NOT DETECTED NOT DETECTED Final   Influenza A NOT DETECTED NOT DETECTED Final   Influenza B NOT DETECTED NOT DETECTED Final   Parainfluenza Virus 1 NOT DETECTED NOT DETECTED Final   Parainfluenza Virus 2 NOT DETECTED NOT DETECTED Final   Parainfluenza Virus 3 NOT DETECTED NOT DETECTED Final   Parainfluenza Virus 4 NOT DETECTED NOT DETECTED Final   Respiratory Syncytial Virus NOT DETECTED NOT DETECTED Final   Bordetella pertussis NOT DETECTED NOT DETECTED Final   Chlamydophila pneumoniae NOT DETECTED NOT DETECTED Final   Mycoplasma pneumoniae NOT DETECTED NOT DETECTED Final    Comment: Performed at [facility], [place]. [address], [place], [place] [address]     Labs: BNP (last 3 results) Recent Labs    11/06/19 1729  BNP 382.5*   Basic Metabolic Panel: Recent Labs  Lab 11/07/19 0654 11/08/19 1025 11/11/19 0852  NA 133* 133* 133*  K 4.5 4.3 4.2  CL 98 96* 95*  CO2 25 28 27   GLUCOSE 146* 147* 142*  BUN 10 17 19   CREATININE 0.79 0.74 0.85  CALCIUM 8.6* 8.8* 8.7*   Liver Function Tests: No results for input(s): AST, ALT, ALKPHOS, BILITOT, PROT, ALBUMIN in the last 168 hours. No results for input(s): LIPASE, AMYLASE in the last  168 hours. No results for input(s): AMMONIA in the last 168 hours. CBC: Recent Labs  Lab 11/07/19 0654 11/11/19 0852  WBC 8.4 13.3*  NEUTROABS  --  10.3*  HGB 13.0 14.3  HCT 37.0* 42.1  MCV 88.5 90.9  PLT 325 376   Cardiac Enzymes: No results for input(s): CKTOTAL, CKMB, CKMBINDEX, TROPONINI in the last 168 hours. BNP: Invalid input(s): POCBNP CBG: No results for input(s): GLUCAP in the last 168 hours. D-Dimer No results for input(s): DDIMER in the last 72 hours. Hgb A1c No results for input(s): HGBA1C in the last 72 hours. Lipid  Profile No results for input(s): CHOL, HDL, LDLCALC, TRIG, CHOLHDL, LDLDIRECT in the last 72 hours. Thyroid function studies No results for input(s): TSH, T4TOTAL, T3FREE, THYROIDAB in the last 72 hours.  Invalid input(s): FREET3 Anemia work up No results for input(s): VITAMINB12, FOLATE, FERRITIN, TIBC, IRON, RETICCTPCT in the last 72 hours. Urinalysis    Component Value Date/Time   COLORURINE YELLOW (A) 11/06/2019 2224   APPEARANCEUR CLEAR (A) 11/06/2019 2224   LABSPEC 1.039 (H) 11/06/2019 2224   PHURINE 6.0 11/06/2019 2224   GLUCOSEU NEGATIVE 11/06/2019 2224   HGBUR NEGATIVE 11/06/2019 2224   BILIRUBINUR NEGATIVE 11/06/2019 2224   KETONESUR 20 (A) 11/06/2019 2224   PROTEINUR NEGATIVE 11/06/2019 2224   NITRITE NEGATIVE 11/06/2019 2224   LEUKOCYTESUR NEGATIVE 11/06/2019 2224   Sepsis Labs Invalid input(s): PROCALCITONIN,  WBC,  LACTICIDVEN Microbiology Recent Results (from the past 240 hour(s))  Resp Panel by RT PCR (RSV, Flu A&B, Covid) - Nasopharyngeal Swab     Status: None   Collection Time: 11/06/19  5:29 PM   Specimen: Nasopharyngeal Swab  Result Value Ref Range Status   SARS Coronavirus 2 by RT PCR NEGATIVE NEGATIVE Final    Comment: (NOTE) SARS-CoV-2 target nucleic acids are NOT DETECTED.  The SARS-CoV-2 RNA is generally detectable in upper respiratoy specimens during the acute phase of infection. The lowest concentration of SARS-CoV-2 viral copies this assay can detect is 131 copies/mL. A negative result does not preclude SARS-Cov-2 infection and should not be used as the sole basis for treatment or other patient management decisions. A negative result may occur with  improper specimen collection/handling, submission of specimen other than nasopharyngeal swab, presence of viral mutation(s) within the areas targeted by this assay, and inadequate number of viral copies (<131 copies/mL). A negative result must be combined with clinical observations, patient history,  and epidemiological information. The expected result is Negative.  Fact Sheet for Patients:  PinkCheek.be  Fact Sheet for Healthcare Providers:  GravelBags.it  This test is no t yet approved or cleared by the [place] FDA and  has been authorized for detection and/or diagnosis of SARS-CoV-2 by FDA under an Emergency Use Authorization (EUA). This EUA will remain  in effect (meaning this test can be used) for the duration of the COVID-19 declaration under Section 564(b)(1) of the Act, 21 U.S.C. section 360bbb-3(b)(1), unless the authorization is terminated or revoked sooner.     Influenza A by PCR NEGATIVE NEGATIVE Final   Influenza B by PCR NEGATIVE NEGATIVE Final    Comment: (NOTE) The Xpert Xpress SARS-CoV-2/FLU/RSV assay is intended as an aid in  the diagnosis of influenza from Nasopharyngeal swab specimens and  should not be used as a sole basis for treatment. Nasal washings and  aspirates are unacceptable for Xpert Xpress SARS-CoV-2/FLU/RSV  testing.  Fact Sheet for Patients: PinkCheek.be  Fact Sheet for Healthcare Providers: GravelBags.it  This test is  not yet approved or cleared by the [place] and  has been authorized for detection and/or diagnosis of SARS-CoV-2 by  FDA under an Emergency Use Authorization (EUA). This EUA will remain  in effect (meaning this test can be used) for the duration of the  Covid-19 declaration under Section 564(b)(1) of the Act, 21  U.S.C. section 360bbb-3(b)(1), unless the authorization is  terminated or revoked.    Respiratory Syncytial Virus by PCR NEGATIVE NEGATIVE Final    Comment: (NOTE) Fact Sheet for Patients: PinkCheek.be  Fact Sheet for Healthcare Providers: GravelBags.it  This test is not yet approved or cleared by the [place] FDA and   has been authorized for detection and/or diagnosis of SARS-CoV-2 by  FDA under an Emergency Use Authorization (EUA). This EUA will remain  in effect (meaning this test can be used) for the duration of the  COVID-19 declaration under Section 564(b)(1) of the Act, 21 U.S.C.  section 360bbb-3(b)(1), unless the authorization is terminated or  revoked. Performed at [facility], [address] [place], [place] [address]   Respiratory Panel by PCR     Status: None   Collection Time: 11/10/19  1:17 PM   Specimen: Nasopharyngeal Swab; Respiratory  Result Value Ref Range Status   Adenovirus NOT DETECTED NOT DETECTED Final   Coronavirus 229E NOT DETECTED NOT DETECTED Final    Comment: (NOTE) The Coronavirus on the Respiratory Panel, DOES NOT test for the novel  Coronavirus (2019 nCoV)    Coronavirus HKU1 NOT DETECTED NOT DETECTED Final   Coronavirus NL63 NOT DETECTED NOT DETECTED Final   Coronavirus OC43 NOT DETECTED NOT DETECTED Final   Metapneumovirus NOT DETECTED NOT DETECTED Final   Rhinovirus / Enterovirus NOT DETECTED NOT DETECTED Final   Influenza A NOT DETECTED NOT DETECTED Final   Influenza B NOT DETECTED NOT DETECTED Final   Parainfluenza Virus 1 NOT DETECTED NOT DETECTED Final   Parainfluenza Virus 2 NOT DETECTED NOT DETECTED Final   Parainfluenza Virus 3 NOT DETECTED NOT DETECTED Final   Parainfluenza Virus 4 NOT DETECTED NOT DETECTED Final   Respiratory Syncytial Virus NOT DETECTED NOT DETECTED Final   Bordetella pertussis NOT DETECTED NOT DETECTED Final   Chlamydophila pneumoniae NOT DETECTED NOT DETECTED Final   Mycoplasma pneumoniae NOT DETECTED NOT DETECTED Final    Comment: Performed at [facility], [place]. [address], [place], [place] [address]     Time coordinating discharge: Over 30 minutes  SIGNED:   Sidney Ace, MD  [facility] 11/13/2019, 11:45 AM Pager   If 7PM-7AM, please contact night-coverage

## 2019-11-15 ENCOUNTER — Ambulatory Visit: Payer: BC Managed Care – PPO | Admitting: Dermatology

## 2020-02-10 ENCOUNTER — Emergency Department: Payer: BC Managed Care – PPO

## 2020-02-10 ENCOUNTER — Inpatient Hospital Stay
Admission: EM | Admit: 2020-02-10 | Discharge: 2020-02-15 | DRG: 196 | Payer: BC Managed Care – PPO | Attending: Internal Medicine | Admitting: Internal Medicine

## 2020-02-10 ENCOUNTER — Other Ambulatory Visit: Payer: Self-pay

## 2020-02-10 DIAGNOSIS — Z8249 Family history of ischemic heart disease and other diseases of the circulatory system: Secondary | ICD-10-CM

## 2020-02-10 DIAGNOSIS — F411 Generalized anxiety disorder: Secondary | ICD-10-CM | POA: Diagnosis present

## 2020-02-10 DIAGNOSIS — E222 Syndrome of inappropriate secretion of antidiuretic hormone: Secondary | ICD-10-CM | POA: Diagnosis present

## 2020-02-10 DIAGNOSIS — Z9889 Other specified postprocedural states: Secondary | ICD-10-CM

## 2020-02-10 DIAGNOSIS — Z20822 Contact with and (suspected) exposure to covid-19: Secondary | ICD-10-CM | POA: Diagnosis present

## 2020-02-10 DIAGNOSIS — Z87891 Personal history of nicotine dependence: Secondary | ICD-10-CM

## 2020-02-10 DIAGNOSIS — Z7952 Long term (current) use of systemic steroids: Secondary | ICD-10-CM | POA: Diagnosis not present

## 2020-02-10 DIAGNOSIS — J9621 Acute and chronic respiratory failure with hypoxia: Secondary | ICD-10-CM

## 2020-02-10 DIAGNOSIS — Z833 Family history of diabetes mellitus: Secondary | ICD-10-CM | POA: Diagnosis not present

## 2020-02-10 DIAGNOSIS — E878 Other disorders of electrolyte and fluid balance, not elsewhere classified: Secondary | ICD-10-CM | POA: Diagnosis present

## 2020-02-10 DIAGNOSIS — J849 Interstitial pulmonary disease, unspecified: Secondary | ICD-10-CM | POA: Diagnosis present

## 2020-02-10 DIAGNOSIS — Z9981 Dependence on supplemental oxygen: Secondary | ICD-10-CM

## 2020-02-10 DIAGNOSIS — Z800 Family history of malignant neoplasm of digestive organs: Secondary | ICD-10-CM

## 2020-02-10 DIAGNOSIS — Z85828 Personal history of other malignant neoplasm of skin: Secondary | ICD-10-CM | POA: Diagnosis not present

## 2020-02-10 DIAGNOSIS — J8410 Pulmonary fibrosis, unspecified: Secondary | ICD-10-CM | POA: Diagnosis present

## 2020-02-10 DIAGNOSIS — H5461 Unqualified visual loss, right eye, normal vision left eye: Secondary | ICD-10-CM | POA: Diagnosis present

## 2020-02-10 DIAGNOSIS — U099 Post covid-19 condition, unspecified: Secondary | ICD-10-CM

## 2020-02-10 DIAGNOSIS — J189 Pneumonia, unspecified organism: Secondary | ICD-10-CM | POA: Diagnosis present

## 2020-02-10 DIAGNOSIS — R0600 Dyspnea, unspecified: Secondary | ICD-10-CM

## 2020-02-10 DIAGNOSIS — R053 Chronic cough: Secondary | ICD-10-CM | POA: Diagnosis present

## 2020-02-10 DIAGNOSIS — T380X5A Adverse effect of glucocorticoids and synthetic analogues, initial encounter: Secondary | ICD-10-CM | POA: Diagnosis present

## 2020-02-10 DIAGNOSIS — Z79899 Other long term (current) drug therapy: Secondary | ICD-10-CM

## 2020-02-10 DIAGNOSIS — F419 Anxiety disorder, unspecified: Secondary | ICD-10-CM

## 2020-02-10 DIAGNOSIS — J441 Chronic obstructive pulmonary disease with (acute) exacerbation: Secondary | ICD-10-CM | POA: Diagnosis present

## 2020-02-10 DIAGNOSIS — J440 Chronic obstructive pulmonary disease with acute lower respiratory infection: Secondary | ICD-10-CM | POA: Diagnosis present

## 2020-02-10 DIAGNOSIS — J9601 Acute respiratory failure with hypoxia: Secondary | ICD-10-CM | POA: Diagnosis present

## 2020-02-10 DIAGNOSIS — E871 Hypo-osmolality and hyponatremia: Secondary | ICD-10-CM | POA: Diagnosis not present

## 2020-02-10 DIAGNOSIS — N401 Enlarged prostate with lower urinary tract symptoms: Secondary | ICD-10-CM | POA: Diagnosis present

## 2020-02-10 DIAGNOSIS — J90 Pleural effusion, not elsewhere classified: Secondary | ICD-10-CM | POA: Diagnosis present

## 2020-02-10 DIAGNOSIS — F4322 Adjustment disorder with anxiety: Secondary | ICD-10-CM | POA: Diagnosis not present

## 2020-02-10 DIAGNOSIS — I10 Essential (primary) hypertension: Secondary | ICD-10-CM | POA: Diagnosis present

## 2020-02-10 DIAGNOSIS — M940 Chondrocostal junction syndrome [Tietze]: Secondary | ICD-10-CM | POA: Diagnosis present

## 2020-02-10 DIAGNOSIS — E861 Hypovolemia: Secondary | ICD-10-CM | POA: Diagnosis present

## 2020-02-10 DIAGNOSIS — R630 Anorexia: Secondary | ICD-10-CM | POA: Diagnosis present

## 2020-02-10 DIAGNOSIS — R0602 Shortness of breath: Secondary | ICD-10-CM

## 2020-02-10 LAB — OSMOLALITY, URINE: Osmolality, Ur: 666 mOsm/kg (ref 300–900)

## 2020-02-10 LAB — BASIC METABOLIC PANEL
Anion gap: 9 (ref 5–15)
Anion gap: 8 (ref 5–15)
Anion gap: 11 (ref 5–15)
BUN: 13 mg/dL (ref 8–23)
BUN: 11 mg/dL (ref 8–23)
BUN: 11 mg/dL (ref 8–23)
CO2: 23 mmol/L (ref 22–32)
CO2: 24 mmol/L (ref 22–32)
CO2: 22 mmol/L (ref 22–32)
Calcium: 8.6 mg/dL — ABNORMAL LOW (ref 8.9–10.3)
Calcium: 8.3 mg/dL — ABNORMAL LOW (ref 8.9–10.3)
Calcium: 8.3 mg/dL — ABNORMAL LOW (ref 8.9–10.3)
Chloride: 86 mmol/L — ABNORMAL LOW (ref 98–111)
Chloride: 89 mmol/L — ABNORMAL LOW (ref 98–111)
Chloride: 93 mmol/L — ABNORMAL LOW (ref 98–111)
Creatinine, Ser: 0.52 mg/dL — ABNORMAL LOW (ref 0.61–1.24)
Creatinine, Ser: 0.6 mg/dL — ABNORMAL LOW (ref 0.61–1.24)
Creatinine, Ser: 0.53 mg/dL — ABNORMAL LOW (ref 0.61–1.24)
GFR, Estimated: >60 mL/min (ref >60)
GFR, Estimated: >60 mL/min (ref >60)
GFR, Estimated: >60 mL/min (ref >60)
Glucose, Bld: 122 mg/dL — ABNORMAL HIGH (ref 70–99)
Glucose, Bld: 148 mg/dL — ABNORMAL HIGH (ref 70–99)
Glucose, Bld: 159 mg/dL — ABNORMAL HIGH (ref 70–99)
Potassium: 4.6 mmol/L (ref 3.5–5.1)
Potassium: 5.1 mmol/L (ref 3.5–5.1)
Potassium: 4.8 mmol/L (ref 3.5–5.1)
Sodium: 118 mmol/L — CL (ref 135–145)
Sodium: 121 mmol/L — ABNORMAL LOW (ref 135–145)
Sodium: 126 mmol/L — ABNORMAL LOW (ref 135–145)

## 2020-02-10 LAB — CBC
HCT: 37.6 % — ABNORMAL LOW (ref 39.0–52.0)
Hemoglobin: 12.9 g/dL — ABNORMAL LOW (ref 13.0–17.0)
MCH: 29.5 pg (ref 26.0–34.0)
MCHC: 34.3 g/dL (ref 30.0–36.0)
MCV: 86 fL (ref 80.0–100.0)
Platelets: 435 10*3/uL — ABNORMAL HIGH (ref 150–400)
RBC: 4.37 MIL/uL (ref 4.22–5.81)
RDW: 13.3 % (ref 11.5–15.5)
WBC: 19.6 10*3/uL — ABNORMAL HIGH (ref 4.0–10.5)
nRBC: 0 % (ref 0.0–0.2)

## 2020-02-10 LAB — PROCALCITONIN: Procalcitonin: 0.12 ng/mL

## 2020-02-10 LAB — RESP PANEL BY RT-PCR (FLU A&B, COVID) ARPGX2
Influenza A by PCR: NEGATIVE
Influenza B by PCR: NEGATIVE
SARS Coronavirus 2 by RT PCR: NEGATIVE

## 2020-02-10 LAB — OSMOLALITY: Osmolality: 263 mOsm/kg — ABNORMAL LOW (ref 275–295)

## 2020-02-10 LAB — SODIUM, URINE, RANDOM: Sodium, Ur: 32 mmol/L

## 2020-02-10 MED ORDER — ONDANSETRON HCL 4 MG PO TABS: 4.0000 mg | ORAL_TABLET | Freq: Four times a day (QID) | ORAL | Status: DC | PRN

## 2020-02-10 MED ORDER — SODIUM CHLORIDE 0.9 % IV BOLUS
1000.0000 mL | Freq: Once | INTRAVENOUS | Status: AC
  Administered 2020-02-10: 17:00:00 1000 mL via INTRAVENOUS

## 2020-02-10 MED ORDER — ESCITALOPRAM OXALATE 10 MG PO TABS
20.0000 mg | ORAL_TABLET | Freq: Every day | ORAL | Status: DC
  Administered 2020-02-11 – 2020-02-15 (×5): 20 mg via ORAL
  Filled 2020-02-10 (×6): qty 2

## 2020-02-10 MED ORDER — IBUPROFEN 400 MG PO TABS: 400.0000 mg | ORAL_TABLET | Freq: Four times a day (QID) | ORAL | Status: DC | PRN

## 2020-02-10 MED ORDER — METHYLPREDNISOLONE SODIUM SUCC 40 MG IJ SOLR
40.0000 mg | Freq: Two times a day (BID) | INTRAMUSCULAR | Status: DC
  Administered 2020-02-10 – 2020-02-12 (×4): 40 mg via INTRAVENOUS
  Filled 2020-02-10 (×4): qty 1

## 2020-02-10 MED ORDER — SODIUM CHLORIDE 0.9 % IV SOLN: INTRAVENOUS | Status: AC

## 2020-02-10 MED ORDER — ENOXAPARIN SODIUM 40 MG/0.4ML ~~LOC~~ SOLN
40.0000 mg | SUBCUTANEOUS | Status: DC
  Administered 2020-02-10 – 2020-02-14 (×5): 40 mg via SUBCUTANEOUS
  Filled 2020-02-10 (×5): qty 0.4

## 2020-02-10 MED ORDER — OXYCODONE HCL 5 MG PO TABS: 5.0000 mg | ORAL_TABLET | ORAL | Status: DC | PRN

## 2020-02-10 MED ORDER — METHYLPREDNISOLONE SODIUM SUCC 125 MG IJ SOLR: 125.0000 mg | Freq: Every day | INTRAMUSCULAR | Status: DC

## 2020-02-10 MED ORDER — FLUTICASONE FUROATE-VILANTEROL 100-25 MCG/INH IN AEPB
1.0000 | INHALATION_SPRAY | Freq: Every day | RESPIRATORY_TRACT | Status: DC
  Administered 2020-02-11 – 2020-02-15 (×5): 1 via RESPIRATORY_TRACT
  Filled 2020-02-10: qty 28

## 2020-02-10 MED ORDER — ONDANSETRON HCL 4 MG/2ML IJ SOLN: 4.0000 mg | Freq: Four times a day (QID) | INTRAMUSCULAR | Status: DC | PRN

## 2020-02-10 MED ORDER — AMLODIPINE BESYLATE 5 MG PO TABS
5.0000 mg | ORAL_TABLET | Freq: Every day | ORAL | Status: DC
  Administered 2020-02-12 – 2020-02-15 (×2): 5 mg via ORAL
  Filled 2020-02-10 (×5): qty 1

## 2020-02-10 MED ORDER — UMECLIDINIUM BROMIDE 62.5 MCG/INH IN AEPB
1.0000 | INHALATION_SPRAY | Freq: Every day | RESPIRATORY_TRACT | Status: DC
  Administered 2020-02-11 – 2020-02-15 (×5): 1 via RESPIRATORY_TRACT
  Filled 2020-02-10: qty 7

## 2020-02-10 MED ORDER — ALPRAZOLAM 0.5 MG PO TABS
0.5000 mg | ORAL_TABLET | Freq: Three times a day (TID) | ORAL | Status: DC
  Administered 2020-02-10 – 2020-02-15 (×14): 0.5 mg via ORAL
  Filled 2020-02-10 (×15): qty 1

## 2020-02-10 MED ORDER — POLYETHYLENE GLYCOL 3350 17 G PO PACK: 17.0000 g | PACK | Freq: Every day | ORAL | Status: DC | PRN

## 2020-02-10 MED ORDER — SULFAMETHOXAZOLE-TRIMETHOPRIM 400-80 MG PO TABS
1.0000 | ORAL_TABLET | Freq: Two times a day (BID) | ORAL | Status: DC
  Administered 2020-02-10 – 2020-02-15 (×10): 1 via ORAL
  Filled 2020-02-10 (×11): qty 1

## 2020-02-10 MED ORDER — AZITHROMYCIN 500 MG PO TABS
500.0000 mg | ORAL_TABLET | Freq: Once | ORAL | Status: AC
  Administered 2020-02-10: 13:00:00 500 mg via ORAL
  Filled 2020-02-10: qty 1

## 2020-02-10 MED ORDER — IPRATROPIUM-ALBUTEROL 0.5-2.5 (3) MG/3ML IN SOLN
3.0000 mL | Freq: Once | RESPIRATORY_TRACT | Status: AC
  Administered 2020-02-10: 13:00:00 3 mL via RESPIRATORY_TRACT
  Filled 2020-02-10: qty 3

## 2020-02-10 MED ORDER — MORPHINE SULFATE (PF) 2 MG/ML IV SOLN
1.0000 mg | INTRAVENOUS | Status: DC | PRN
  Administered 2020-02-13 – 2020-02-14 (×4): 1 mg via INTRAVENOUS
  Filled 2020-02-10 (×4): qty 1

## 2020-02-10 MED ORDER — AZITHROMYCIN 500 MG PO TABS
500.0000 mg | ORAL_TABLET | Freq: Every day | ORAL | Status: DC
  Administered 2020-02-11 – 2020-02-15 (×5): 500 mg via ORAL
  Filled 2020-02-10 (×5): qty 1

## 2020-02-10 MED ORDER — METOPROLOL TARTRATE 50 MG PO TABS
50.0000 mg | ORAL_TABLET | Freq: Two times a day (BID) | ORAL | Status: DC
  Administered 2020-02-10 – 2020-02-15 (×9): 50 mg via ORAL
  Filled 2020-02-10 (×9): qty 1

## 2020-02-10 MED ORDER — SODIUM CHLORIDE 0.9% FLUSH
3.0000 mL | Freq: Two times a day (BID) | INTRAVENOUS | Status: DC
  Administered 2020-02-10 – 2020-02-15 (×8): 3 mL via INTRAVENOUS

## 2020-02-10 MED ORDER — IPRATROPIUM-ALBUTEROL 0.5-2.5 (3) MG/3ML IN SOLN: 3.0000 mL | Freq: Four times a day (QID) | RESPIRATORY_TRACT | Status: DC | PRN

## 2020-02-10 MED ORDER — METHYLPREDNISOLONE SODIUM SUCC 125 MG IJ SOLR
125.0000 mg | INTRAMUSCULAR | Status: AC
  Administered 2020-02-10: 13:00:00 125 mg via INTRAVENOUS
  Filled 2020-02-10: qty 2

## 2020-02-10 MED ORDER — SODIUM CHLORIDE 1 G PO TABS
1.0000 g | ORAL_TABLET | Freq: Three times a day (TID) | ORAL | Status: DC
  Administered 2020-02-10 – 2020-02-11 (×3): 1 g via ORAL
  Filled 2020-02-10 (×3): qty 1

## 2020-02-10 MED ORDER — TRAZODONE HCL 50 MG PO TABS
50.0000 mg | ORAL_TABLET | Freq: Every evening | ORAL | Status: DC | PRN
Start: 2020-02-10 — End: 2020-02-16
  Administered 2020-02-12 – 2020-02-14 (×5): 50 mg via ORAL
  Filled 2020-02-10 (×4): qty 1

## 2020-02-10 MED ORDER — FLUTICASONE-UMECLIDIN-VILANT 100-62.5-25 MCG/INH IN AEPB: 1.0000 | INHALATION_SPRAY | Freq: Every day | RESPIRATORY_TRACT | Status: DC

## 2020-02-10 NOTE — ED Triage Notes (Signed)
Pt presents via EMS c/o exertional dyspnea x1 week. Pt also reports chest pain with deep inspiration. Reports using 02 at home with some activities but not at all times.

## 2020-02-10 NOTE — H&P (Signed)
[facility] History and Physical  ERUBEY SUPPES [id number] DOB: Aug 08, 1955 DOA: 02/10/2020  Referring physician: Dr. Jacqualine Code PCP: Juluis Pitch, MD   Chief Complaint: Shortness of breath  HPI: Austin Kim is a 64 y.o. 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  He was discharged with home oxygen and has subsequently been following with the pulmonary clinic where a more extensive work-up has been conducted.  Seen most recently at [facility] on February 02, 2020, at that time had recently gotten a second opinion at [facility] and there was some discussion of possible antifibrotic therapy.  Over the past several months he has been on chronic steroids with good response while on and rapid worsening in symptoms when tapered, has also been taking Bactrim prophylaxis.  Today reports breathing has been worsening Even minimal exertion, walking a few steps even, will cause dyspnea Was having chest pain in R side of his chest but that has not been present for some time, attributes it to coughing/costochondritis Overall symptoms started to progress about a week ago Wife also notes he has not had any appetite as well as difficulty urinating for the past week, but no burning or pain with urination Was vaccinated against COVID earlier this year, has not yet had his booster, no recent exposures that he knows of Endorsing significant anxiety as well, had been taking Valium but stopped a week ago Wife has noticed increased dyspnea even at rest over the past few  days Has chronic cough, has been coughing more of late No sputum production No fevers, no rashes or skin changes, no nausea or vomiting Did have some diarrhea today, has not had previously  Has been taking steroids chronically since 10/2019, first started at 32 and was tapered off Deteriorated quickly so was put back on 20mg , this cycle repeated again and is currently on 20mg  and has not recently tapered  In the ED initial vital signs notable for mild tachypnea to the low 20s, normal blood pressure, and above home baseline oxygen need of 3 L.  Lab work-up notable for BMP with sodium of 118 down from the last check of 133 three months ago, as well as worsening hypochloremia, normal creatinine.  CBC notable for white count of 19.  Chest x-ray notable for largely unchanged findings from last imaging though superimposed underlying superinfection cannot be excluded.  ED physician consulted with his pulmonologist Dr. Lanney Gins, he was given 125 mg methylprednisolone, Zithromax 500 mg, and duo nebs.  He was admitted for further management and work-up of his acute on chronic hypoxemic respiratory failure.  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 He has never used smokeless tobacco. He reports current alcohol use. He reports that he does not use drugs.  No Known Allergies  Family History  Problem Relation Age of Onset   Diabetes Father    CAD Father    Colon cancer Father    Heart disease Father        Heart transplant   Diabetes Brother        Type 1 DM     Prior to Admission medications   Medication Sig Start Date End Date Taking? Authorizing Provider  albuterol (VENTOLIN HFA) 108 (90 Base) MCG/ACT inhaler Inhale 2 puffs into the lungs every 6 (six) hours as needed for wheezing or shortness of breath. 11/13/19  Yes Sreenath, Sudheer B, MD  amLODipine (NORVASC) 5 MG tablet Take 5 mg by mouth daily. 09/08/18  Yes [provider]  benzonatate (TESSALON) 200 MG capsule Take 1 capsule (200 mg total) by mouth 3 (three) times daily. 11/13/19  Yes Sreenath, Sudheer B, MD  metoprolol tartrate (LOPRESSOR) 50 MG tablet Take 50 mg by mouth 2 (two) times daily. 05/03/19  Yes [provider]  predniSONE (DELTASONE) 20 MG tablet Take 20 mg by mouth daily. 02/02/20  Yes [provider]  sulfamethoxazole-trimethoprim (BACTRIM) 400-80 MG tablet Take 1 tablet by mouth 2 (two) times daily. 02/03/20  Yes [provider]  TRELEGY ELLIPTA 100-62.5-25 MCG/INH AEPB Inhale 1 puff into the lungs daily. 01/20/20  Yes [provider]  loratadine (CLARITIN) 10 MG tablet Take 10 mg by mouth daily.    [provider]   Physical Exam: Vitals:   02/10/20 1257 02/10/20 1345 02/10/20 1445 02/10/20 1515  BP: 102/69  123/75 123/75  Pulse: 85 93 (!) 101 99  Resp: 20   19  Temp:      TempSrc:      SpO2: 100% 97% 99% 97%    Wt Readings from Last 3 Encounters:  11/06/19 72.6 kg  01/18/18 71.7 kg  07/28/17 72.6 kg      General:  Appears anxious, sitting upright  Eyes: PERRL, normal lids,  irises & conjunctiva  ENT: grossly normal hearing, lips & tongue  Neck: no LAD, masses or thyromegaly  Cardiovascular: RRR, no m/r/g. No LE edema.  Telemetry: SR, no arrhythmias   Respiratory: increased work of breathing, fine crackles in R lung base, no wheezes  Abdomen: soft, ntnd  Skin: no rash or induration seen on limited exam  Musculoskeletal: grossly normal tone BUE/BLE  Psychiatric: grossly normal mood and affect, speech fluent and appropriate  Neurologic: grossly non-focal.          Labs on Admission:  Basic Metabolic Panel: Recent Labs  Lab 02/10/20 1055  NA 118*  K 4.6  CL 86*  CO2 23  GLUCOSE 122*  BUN 13  CREATININE 0.52*  CALCIUM 8.6*   Liver Function Tests: No results for input(s): AST, ALT, ALKPHOS, BILITOT, PROT, ALBUMIN in the last 168 hours. No results for input(s): LIPASE, AMYLASE in the last 168 hours. No results  for input(s): AMMONIA in the last 168 hours. CBC: Recent Labs  Lab 02/10/20 1055  WBC 19.6*  HGB 12.9*  HCT 37.6*  MCV 86.0  PLT 435*   Cardiac Enzymes: No results for input(s): CKTOTAL, CKMB, CKMBINDEX, TROPONINI in the last 168 hours.  BNP (last 3 results) Recent Labs    11/06/19 1729  BNP 121.9*    ProBNP (last 3 results) No results for input(s): PROBNP in the last 8760 hours.  CBG: No results for input(s): GLUCAP in the last 168 hours.  Radiological Exams on Admission: DG Chest 2 View  Result Date: 02/10/2020 CLINICAL DATA:  Shortness of breath. Chest pain with deep inspiration. Home oxygen use. EXAM: CHEST - 2 VIEW COMPARISON:  11/12/2019.  CTA chest 11/06/2019. FINDINGS: Midline trachea. Mild cardiomegaly. No pleural effusion or pneumothorax. Biapical pleural thickening. Basilar predominant interstitial opacities are similar. Relative sparing of the lung apices. IMPRESSION: Similar basilar predominant interstitial opacities. When compared to 11/06/2019 CT, this is primarily felt to represent post  infectious/inflammatory fibrosis. Superimposed acute/subacute pneumonia cannot be excluded. Electronically Signed   By: Abigail Miyamoto M.D.   On: 02/10/2020 11:41    EKG: Independently reviewed.  Sinus rhythm, unremarkable EKG without ischemic changes.  Assessment/Plan Active Problems:   ILD (interstitial lung disease) (HCC)   Hypertension   Hyponatremia   COPD with acute exacerbation ([place])   Acute hypoxemic respiratory failure ([place])  YE ROCCHI Kim is a 64 y.o. 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  #Acute on chronic hypoxemic respiratory failure #Interstitial lung disease #COPD Patient presenting with 1 week course of acute on chronic worsening dyspnea as well as increased oxygen requirement.  Work-up so far unrevealing, chest x-ray unremarkable, suspect symptoms are due to worsening of his underlying ILD. Dr. Lanney Gins from pulmonary has been consulted and will see the patient. -Continue methylprednisolone and azithromycin per Pulm -Continue Trelegy Ellipta, nebs -Continue Bactrim -Further work-up per pulmonary  #Hyponatremia Suspect secondary to hypovolemia given con commitment hypochloremia and patient's report of poor p.o. intake over the past week.  Will bolus 1 L normal saline and put on normal saline infusion for the next day.  We will also order basic labs for further work-up.  Can also start salt tabs. -1 L NS bolus and normal saline drip at 100 cc an hour -Follow-up urine sodium, urine osmolality, serum osmolality -Trend sodium every 6 hours for time being  #Leukocytosis Possibly secondary to underlying infection, but may also be due to chronic steroid use, continue to trend.  #LUTS Reporting new worsening of difficulty urinating, will obtain bladder scan to  further evaluate.  If blood pressures remain stable can trial Flomax.  Low suspicion for infection but will send for culture.  #Chronic medical problems Poor appetite-likely multifactorial in setting of interstitial lung disease and chronic illness, consult nutrition  Anxiety-may need to be readdressed during this hospitalization, continue Lexapro, consider resuming benzodiazepine  Hypertension-continue amlodipine, metoprolol  Code Status: Full Code DVT Prophylaxis: Lovenox Family Communication: Wife updated at bedside Disposition Plan: Admit to Inpatient, Med-Surg   Time spent: 72 min  Clarnce Flock MD/MPH [facility]

## 2020-02-10 NOTE — Consult Note (Signed)
Pulmonary Medicine          Date: 02/10/2020,   MRN# [id number] KAMRYNN LUTY III 05-31-1955     Admission                  Current    Referring physician Dr. Jacqualine Code   CHIEF COMPLAINT:   Acute exacerbation of interstitial lung disease.   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  Seen in outpatient pulmonary clinic with mild improvement had additional evaluation at [facility] with ILD clinic thought to have possible ILD flare and perhaps initiation of antifibrotic therapy at some point.  States he had 1 day of diarrhea 1 day prior to admission no fevers, denies hemoptysis, still has chronic cough which is dry with costochondritis.  He is also noted to have hyponatremia at 118 which is worsened from previous which was 128 in September during his admission.  He did admit to taking multiple doses of NSAIDs including naproxen twice daily.  And recently started Lexapro due to ongoing general anxiety disorder refractory to Valium at home.  He had chest x-ray done here which is improved compared to previous in September mostly with interstitial basal predominant opacities consistent with known pulmonary fibrosis.  There is no pneumothorax, no consolidated infiltrate, no signs of pleural effusion, pulmonary edema.  Blood work is not consistent with infection, pulmonary consultation placed for additional management of ILD flare  PAST MEDICAL HISTORY   Past Medical History:  Diagnosis Date  . Blindness of right eye   . COPD (chronic obstructive  pulmonary disease) ([place])   . Elevated lipids   . Hx of basal cell carcinoma 02/05/2010   L temple  . Hx of dysplastic nevus 04/12/2019   R spinal mid back, moderate to severe atypia, excised 05/09/2019  . Hypertension   . Spontaneous pneumothorax   . Squamous cell carcinoma of skin 04/12/2019   Posterior Crown, SCC IS     SURGICAL HISTORY   Past Surgical History:  Procedure Laterality Date  . BASAL CELL CARCINOMA EXCISION Bilateral   . COLONOSCOPY WITH PROPOFOL N/A 07/28/2017   Procedure: COLONOSCOPY WITH PROPOFOL;  Surgeon: Lollie Sails, MD;  Location: [facility] ENDOSCOPY;  Service: Endoscopy;  Laterality: N/A;  . EYE SURGERY    . PLEURAL SCARIFICATION       FAMILY HISTORY   Family History  Problem Relation Age of Onset  . Diabetes Father   . CAD Father   . Colon cancer Father   . Heart disease Father        Heart transplant  . Diabetes Brother        Type 1 DM     SOCIAL HISTORY   Social History   Tobacco Use  . Smoking status: Former Smoker    Quit date: 2001    Years since quitting: 20.9  . Smokeless tobacco: Never Used  . Tobacco comment: 2 PPD for ~20 years  Vaping Use  . Vaping Use: Never used  Substance Use Topics  . Alcohol use: Yes  . Drug use: No  MEDICATIONS    Home Medication:  Current Outpatient Rx  . Order #: [id number] Class: Normal  . Order #: [id number] Class: Historical Med  . Order #: [id number] Class: Historical Med  . Order #: [id number] Class: Historical Med  . Order #: [id number] Class: Historical Med  . Order #: [id number] Class: Historical Med  . Order #: [id number] Class: Historical Med  . Order #: [id number] Class: Historical Med  . Order #: [id number] Class: Historical Med    Current Medication:  Current Facility-Administered Medications:  .  [START ON 02/11/2020] azithromycin (ZITHROMAX) tablet 500 mg, 500 mg, Oral, Daily, Dione Plover, Annice Needy, MD .  ipratropium-albuterol (DUONEB) 0.5-2.5 (3) MG/3ML nebulizer solution 3 mL, 3  mL, Nebulization, Q6H PRN, Clarnce Flock, MD .  Derrill Memo ON 02/11/2020] methylPREDNISolone sodium succinate (SOLU-MEDROL) 125 mg/2 mL injection 125 mg, 125 mg, Intravenous, Daily, Dione Plover, Annice Needy, MD  Current Outpatient Medications:  .  albuterol (VENTOLIN HFA) 108 (90 Base) MCG/ACT inhaler, Inhale 2 puffs into the lungs every 6 (six) hours as needed for wheezing or shortness of breath., Disp: 8 g, Rfl: 2 .  amLODipine (NORVASC) 5 MG tablet, Take 5 mg by mouth daily., Disp: , Rfl:  .  escitalopram (LEXAPRO) 20 MG tablet, Take 20 mg by mouth daily., Disp: , Rfl:  .  guaiFENesin (ROBITUSSIN) 100 MG/5ML SOLN, Take 10-15 mLs by mouth every 4 (four) hours as needed for cough or to loosen phlegm., Disp: , Rfl:  .  metoprolol tartrate (LOPRESSOR) 50 MG tablet, Take 50 mg by mouth 2 (two) times daily., Disp: , Rfl:  .  naproxen sodium (ALEVE) 220 MG tablet, Take 220-440 mg by mouth 2 (two) times daily as needed (pain)., Disp: , Rfl:  .  predniSONE (DELTASONE) 20 MG tablet, Take 20 mg by mouth daily., Disp: , Rfl:  .  sulfamethoxazole-trimethoprim (BACTRIM) 400-80 MG tablet, Take 1 tablet by mouth 2 (two) times daily., Disp: , Rfl:  .  TRELEGY ELLIPTA 100-62.5-25 MCG/INH AEPB, Inhale 1 puff into the lungs daily., Disp: , Rfl:     ALLERGIES   Patient has no known allergies.     REVIEW OF SYSTEMS    Review of Systems:  Gen:  Denies  fever, sweats, chills weigh loss  HEENT: Denies blurred vision, double vision, ear pain, eye pain, hearing loss, nose bleeds, sore throat Cardiac:  No dizziness, chest pain or heaviness, chest tightness,edema Resp: reports cough chronically   Gi: reports diarreah  Gu:  Reports new onset dribbling of urine Ext:   Denies Joint pain, stiffness or swelling Skin: Denies  skin rash, easy bruising or bleeding or hives Endoc:  Denies polyuria, polydipsia , polyphagia or weight change Psych:   Reports severe anxiety chronically Other:  All other systems  negative   VS: BP 123/75   Pulse 99   Temp 97.9 F (36.6 C) (Oral)   Resp 19   SpO2 97%      PHYSICAL EXAM    GENERAL:NAD, no fevers, chills, no weakness no fatigue HEAD: Normocephalic, atraumatic.  EYES: Pupils equal, round, reactive to light. Extraocular muscles intact. No scleral icterus.  MOUTH: Moist mucosal membrane. Dentition intact. No abscess noted.  EAR, NOSE, THROAT: Clear without exudates. No external lesions.  NECK: Supple. No thyromegaly. No nodules. No JVD.  PULMONARY: velcro crepitations at bases bilaterally  CARDIOVASCULAR: S1 and S2. Regular rate and rhythm. No murmurs, rubs, or gallops. No edema. Pedal pulses 2+ bilaterally.  GASTROINTESTINAL: Soft, nontender, nondistended. No masses. Positive bowel sounds. No hepatosplenomegaly.  MUSCULOSKELETAL: No swelling, clubbing,  or edema. Range of motion full in all extremities.  NEUROLOGIC: Cranial nerves II through XII are intact. No gross focal neurological deficits. Sensation intact. Reflexes intact.  SKIN: No ulceration, lesions, rashes, or cyanosis. Skin warm and dry. Turgor intact.  PSYCHIATRIC: Mood, affect within normal limits. The patient is awake, alert and oriented x 3. Insight, judgment intact.       IMAGING    DG Chest 2 View  Result Date: 02/10/2020 CLINICAL DATA:  Shortness of breath. Chest pain with deep inspiration. Home oxygen use. EXAM: CHEST - 2 VIEW COMPARISON:  11/12/2019.  CTA chest 11/06/2019. FINDINGS: Midline trachea. Mild cardiomegaly. No pleural effusion or pneumothorax. Biapical pleural thickening. Basilar predominant interstitial opacities are similar. Relative sparing of the lung apices. IMPRESSION: Similar basilar predominant interstitial opacities. When compared to 11/06/2019 CT, this is primarily felt to represent post infectious/inflammatory fibrosis. Superimposed acute/subacute pneumonia cannot be excluded. Electronically Signed   By: Abigail Miyamoto M.D.   On: 02/10/2020 11:41       ASSESSMENT/PLAN   Acute exacerbation of interstitial lung disease   - patient is with stable CXR with no interval changes from previous done this month on outpatient in pulmonary clinic.   - patient is at home O2 setting - 3L [place]  - he respnoded well to IV solumedrol during previous exacerbation and has received solumedrol on presentation to ED. - significant dyspnea and breathlessness - will add low dose IV morphine  -has been on bactrim for PCP ppx and unlikely has bacterial superinfection.  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 - patient has been taking several doses of NSAIDS daily, he does have increased H2O intake and low Na intake PO as well as initiaion of lexapro on outpatient all of which are likely contributing to hyponatremia - have added TID salt tabs with meals  - patient has poor po intake  -repeat Urine Na   Generalized anxiety disorder  - s/p Valium on outpatient with recent tapering off which may have caused recurrence of anxiety symptoms  - have added 0.5 TID alprazolam PO  - consider psychiatric evaluation    Combined pulmonary fibrosis and emphysema (CPFE)   Patient with bullous emphysema - he is high risk for pneumothorax with this froceful dry cough, id prefer patient be on some anti-tussive to prevent worsening costochondritis, pneumothorax, and rarely rib and sternal fracture.   - he is on PRN morphine 1mg  and I will add scheduled Hycodan 71ml     Thank you for allowing me to participate in the care of this patient.   Patient/Family are satisfied with care plan and all questions have been answered.  This document was prepared using Dragon voice recognition software and may include unintentional dictation errors.     Ottie Glazier, M.D.  Division of [place]

## 2020-02-10 NOTE — ED Triage Notes (Signed)
Pt comes into the ED via EMS from home with c/o right sided CP with SOB, on 3L [place] at home 93%, 143/70, 92HR, 36RR. Hx of COPD, emphysema

## 2020-02-10 NOTE — ED Provider Notes (Signed)
[facility] Emergency Department Provider Note   ____________________________________________   Event Date/Time   First MD Initiated Contact with Patient 02/10/20 1136     (approximate)  I have reviewed the triage vital signs and the nursing notes.   HISTORY  Chief Complaint Shortness of Breath    HPI Austin Kim is a 64 y.o. male here for evaluation of shortness of breath  Patient has a history of COPD, possible pulmonary fibrosis post Covid or due to other cause.  Follows with pulmonary.  He reports of the last day started having increasing shortness of breath whenever he walks especially back-and-forth to things like a bathroom.  Feels similar to when he came into the hospital in September.  He is not have any fever.  Reports not much of a cough  Has not noticed any wheezing no abdominal pain no headaches.  Symptoms alleviated by resting and worsened by walking no chest pain.   Reports drinking less fluid over the last day because he does not want to run back and forth to the bathroom.  Past Medical History:  Diagnosis Date  . Blindness of right eye   . COPD (chronic obstructive pulmonary disease) ([place])   . Elevated lipids   . Hx of basal cell carcinoma 02/05/2010   L temple  . Hx of dysplastic nevus 04/12/2019   R spinal mid back, moderate to severe atypia, excised 05/09/2019  . Hypertension   . Spontaneous pneumothorax   . Squamous cell carcinoma of skin 04/12/2019   Posterior Crown, SCC IS    Patient Active Problem List   Diagnosis Date Noted  . Acute hypoxemic respiratory failure ([place]) 02/10/2020  . Malnutrition of moderate degree 11/09/2019  . COPD with acute exacerbation ([place]) 11/08/2019  . ILD (interstitial lung disease) ([place]) 11/06/2019  . Hypertension   . Acute respiratory failure with hypoxia ([place])   . Hyponatremia   . Atypical pneumonia     Past Surgical History:  Procedure Laterality Date  . BASAL CELL  CARCINOMA EXCISION Bilateral   . COLONOSCOPY WITH PROPOFOL N/A 07/28/2017   Procedure: COLONOSCOPY WITH PROPOFOL;  Surgeon: Lollie Sails, MD;  Location: [facility] ENDOSCOPY;  Service: Endoscopy;  Laterality: N/A;  . EYE SURGERY    . PLEURAL SCARIFICATION      Prior to Admission medications   Medication Sig Start Date End Date Taking? Authorizing Provider  albuterol (VENTOLIN HFA) 108 (90 Base) MCG/ACT inhaler Inhale 2 puffs into the lungs every 6 (six) hours as needed for wheezing or shortness of breath. 11/13/19   Ralene Muskrat B, MD  amLODipine (NORVASC) 5 MG tablet Take 1 tablet by mouth 1 day or 1 dose. 09/08/18   [provider]  benzonatate (TESSALON) 200 MG capsule Take 1 capsule (200 mg total) by mouth 3 (three) times daily. 11/13/19   Sidney Ace, MD  loratadine (CLARITIN) 10 MG tablet Take 10 mg by mouth daily.    [provider]  metoprolol tartrate (LOPRESSOR) 50 MG tablet Take 50 mg by mouth 2 (two) times daily. 05/03/19   [provider]    Allergies Patient has no known allergies.  Family History  Problem Relation Age of Onset  . Diabetes Father   . CAD Father   . Colon cancer Father   . Heart disease Father        Heart transplant  . Diabetes Brother        Type 1 DM    Social  History Social History   Tobacco Use  . Smoking status: Former Smoker    Quit date: 2001    Years since quitting: 20.9  . Smokeless tobacco: Never Used  . Tobacco comment: 2 PPD for ~20 years  Vaping Use  . Vaping Use: Never used  Substance Use Topics  . Alcohol use: Yes  . Drug use: No    Review of Systems Constitutional: No fever/chills Eyes: No visual changes. ENT: No sore throat. Cardiovascular: Denies chest pain. Respiratory: See HPI Gastrointestinal: No abdominal pain.   Genitourinary: Negative for dysuria. Musculoskeletal: Negative for back pain. Skin: Negative for rash.  No leg swelling Neurological: Negative for headaches, areas  of focal weakness or numbness.    ____________________________________________   PHYSICAL EXAM:  VITAL SIGNS: ED Triage Vitals  Enc Vitals Group     BP 02/10/20 1049 132/70     Pulse Rate 02/10/20 1049 86     Resp 02/10/20 1049 (!) 24     Temp 02/10/20 1049 97.9 F (36.6 C)     Temp Source 02/10/20 1049 Oral     SpO2 02/10/20 1049 94 %     Weight --      Height --      Head Circumference --      Peak Flow --      Pain Score 02/10/20 1053 0     Pain Loc --      Pain Edu? --      Excl. in [place]? --     Constitutional: Alert and oriented.  Mildly ill-appearing just moderately tachypneic to slightly tachypneic eyes: Conjunctivae are normal. Head: Atraumatic. Nose: No congestion/rhinnorhea. Mouth/Throat: Mucous membranes are moist. Neck: No stridor.  Cardiovascular: Normal rate, regular rhythm. Grossly normal heart sounds.  Good peripheral circulation. Respiratory: Slight tachypnea.  No wheezes or rales.  On 4 L nasal cannula saturations in the high 90s.  Does not have evidence of impending failure. Gastrointestinal: Soft and nontender. No distention. Musculoskeletal: No lower extremity tenderness nor edema. Neurologic:  Normal speech and language. No gross focal neurologic deficits are appreciated.  Skin:  Skin is warm, dry and intact. No rash noted. Psychiatric: Mood and affect are normal. Speech and behavior are normal.  ____________________________________________   LABS (all labs ordered are listed, but only abnormal results are displayed)  Labs Reviewed  BASIC METABOLIC PANEL - Abnormal; Notable for the following components:      Result Value   Sodium 118 (*)    Chloride 86 (*)    Glucose, Bld 122 (*)    Creatinine, Ser 0.52 (*)    Calcium 8.6 (*)    All other components within normal limits  CBC - Abnormal; Notable for the following components:   WBC 19.6 (*)    Hemoglobin 12.9 (*)    HCT 37.6 (*)    Platelets 435 (*)    All other components within normal  limits  CULTURE, BLOOD (ROUTINE X 2)  CULTURE, BLOOD (ROUTINE X 2)  POC SARS CORONAVIRUS 2 AG -  ED   ____________________________________________  EKG  ED ECG REPORT I, Delman Kitten, the attending physician, personally viewed and interpreted this ECG.  Date: 02/10/2020 EKG Time: 1045 Rate: 90 Rhythm: normal sinus rhythm QRS Axis: normal Intervals: normal ST/T Wave abnormalities: normal Narrative Interpretation: no evidence of acute ischemia  ____________________________________________  RADIOLOGY  DG Chest 2 View  Result Date: 02/10/2020 CLINICAL DATA:  Shortness of breath. Chest pain with deep inspiration. Home oxygen use. EXAM: CHEST -  2 VIEW COMPARISON:  11/12/2019.  CTA chest 11/06/2019. FINDINGS: Midline trachea. Mild cardiomegaly. No pleural effusion or pneumothorax. Biapical pleural thickening. Basilar predominant interstitial opacities are similar. Relative sparing of the lung apices. IMPRESSION: Similar basilar predominant interstitial opacities. When compared to 11/06/2019 CT, this is primarily felt to represent post infectious/inflammatory fibrosis. Superimposed acute/subacute pneumonia cannot be excluded. Electronically Signed   By: Abigail Miyamoto M.D.   On: 02/10/2020 11:41    Imaging reviewed, bibasilar opacities.  Similar to previous ____________________________________________   PROCEDURES  Procedure(s) performed: None  Procedures  Critical Care performed: No  ____________________________________________   INITIAL IMPRESSION / ASSESSMENT AND PLAN / ED COURSE  Pertinent labs & imaging results that were available during my care of the patient were reviewed by me and considered in my medical decision making (see chart for details).   Patient transfer shortness of breath.  Worsening over the last day.  History of concern for pulmonary fibrosis etiology unclear.  He has worsening shortness of breath.  He does not appear particularly hypovolemic or  hypervolemic.  He does have significant hyponatremia.  Clinical Course as of 02/10/20 1347  Fri Feb 10, 2020  1145 Paged Dr. Lanney Gins to review and provide pulmonary recs. [MQ]    Clinical Course User Index [MQ] Delman Kitten, MD   ----------------------------------------- 2:27 PM on 02/10/2020 -----------------------------------------  Patient case reviewed with pulmonary Dr. Lanney Gins his recommendation is to treat with steroids and azithromycin at this point.  Patient will be admitted to the hospital for further care and management, pulmonary consult ordered.  In addition, patient's hyponatremia I am somewhat ambivalent as to whether this represents hypo or hypervolemic or euvolemic etiology.  Discussed with the hospitalist, hospitalist Dr. Dione Plover will further evaluate and work-up.   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

## 2020-02-10 NOTE — Progress Notes (Signed)
Urine sent to lab per orders, wife at bedside, ordered fluids running.

## 2020-02-11 DIAGNOSIS — J441 Chronic obstructive pulmonary disease with (acute) exacerbation: Secondary | ICD-10-CM | POA: Diagnosis not present

## 2020-02-11 DIAGNOSIS — J9601 Acute respiratory failure with hypoxia: Secondary | ICD-10-CM

## 2020-02-11 LAB — COMPREHENSIVE METABOLIC PANEL
ALT: 19 U/L (ref 0–44)
AST: 18 U/L (ref 15–41)
Albumin: 2.5 g/dL — ABNORMAL LOW (ref 3.5–5.0)
Alkaline Phosphatase: 47 U/L (ref 38–126)
Anion gap: 8 (ref 5–15)
BUN: 8 mg/dL (ref 8–23)
CO2: 24 mmol/L (ref 22–32)
Calcium: 8.5 mg/dL — ABNORMAL LOW (ref 8.9–10.3)
Chloride: 97 mmol/L — ABNORMAL LOW (ref 98–111)
Creatinine, Ser: 0.51 mg/dL — ABNORMAL LOW (ref 0.61–1.24)
GFR, Estimated: >60 mL/min (ref >60)
Glucose, Bld: 121 mg/dL — ABNORMAL HIGH (ref 70–99)
Potassium: 4.7 mmol/L (ref 3.5–5.1)
Sodium: 129 mmol/L — ABNORMAL LOW (ref 135–145)
Total Bilirubin: 0.8 mg/dL (ref 0.3–1.2)
Total Protein: 5.8 g/dL — ABNORMAL LOW (ref 6.5–8.1)

## 2020-02-11 LAB — STREP PNEUMONIAE URINARY ANTIGEN: Strep Pneumo Urinary Antigen: NEGATIVE

## 2020-02-11 LAB — URINE CULTURE: Culture: <10000 — AB

## 2020-02-11 LAB — CBC
HCT: 32.6 % — ABNORMAL LOW (ref 39.0–52.0)
Hemoglobin: 11.4 g/dL — ABNORMAL LOW (ref 13.0–17.0)
MCH: 30.6 pg (ref 26.0–34.0)
MCHC: 35 g/dL (ref 30.0–36.0)
MCV: 87.4 fL (ref 80.0–100.0)
Platelets: 367 10*3/uL (ref 150–400)
RBC: 3.73 MIL/uL — ABNORMAL LOW (ref 4.22–5.81)
RDW: 13.5 % (ref 11.5–15.5)
WBC: 13.5 10*3/uL — ABNORMAL HIGH (ref 4.0–10.5)
nRBC: 0 % (ref 0.0–0.2)

## 2020-02-11 MED ORDER — SODIUM CHLORIDE 1 G PO TABS
1.0000 g | ORAL_TABLET | Freq: Two times a day (BID) | ORAL | Status: DC
  Administered 2020-02-11 – 2020-02-12 (×2): 1 g via ORAL
  Filled 2020-02-11 (×2): qty 1

## 2020-02-11 NOTE — Plan of Care (Signed)

## 2020-02-11 NOTE — Progress Notes (Signed)
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  Patient has been following up with pulmonary clinic at [facility] clinic where extensive work-up has been conducted.  Patient has been on chronic steroids with good response,  while being on the steroids symptoms remains under good control  His symptoms get worse as his steroids get tapering off.  He is also been on Bactrim prophylaxis. Patient is admitted for acute on chronic hypoxic respiratory failure.  Pulmonology was consulted,  recommended IV steroids Zithromax. Anxiety medications, IV morphine.  Assessment & Plan:   Active Problems:   ILD (interstitial lung disease) ([place])   Hypertension   Hyponatremia   COPD with acute exacerbation (HCC)   Acute hypoxemic respiratory failure (HCC)   Acute on chronic hypoxemic respiratory failure due to interstitial lung disease and COPD:  Patient presenting with 1 week hx. of acute on chronic worsening dyspnea as well as increased oxygen requirement.   Work-up so far unrevealing, Chest x-ray unremarkable, suspect symptoms are due to worsening of his underlying ILD.  Dr. Lanney Gins from pulmonary has been consulted . Continue methylprednisolone and azithromycin per Pulm Continue Trelegy Ellipta, nebs Continue Bactrim Recommended morphine for for dyspnea, anxiety medication, antitussive medication. Covid test negative.  #Hyponatremia: Suspect this could be secondary to hypovolemia,  patient reports poor p.o. intake for last 1 week. Start salt tablets 2 g 3 times daily.   Continue normal saline  for 24 hours. Follow-up urine sodium, urine osmolality, serum osmolality Trend sodium every 6 hours for time  being  #Leukocytosis: This could be secondary to underlying infection also could be due to chronic steroid use. Trend WBC curve.  #LUTS Reporting new worsening of difficulty urinating, will obtain bladder scan to further evaluate.  If blood pressures remain stable can trial Flomax.  Low suspicion for infection but will send for culture.  Poor appetite-likely multifactorial in setting of interstitial lung disease and chronic illness, consult nutrition  Anxiety-continue Lexapro, continue Xanax 0.5 mg 3 times daily.  Hypertension-continue amlodipine, metoprolol   DVT prophylaxis: Lovenox  Code Status: Full code Family Communication: Wife at bedside  Disposition Plan:  Status is: Inpatient  Remains inpatient appropriate because:Inpatient level of care appropriate due to severity of illness   Dispo: The patient is from: Home              Anticipated d/c is to: Home              Anticipated d/c date is: > 3 days              Patient currently is medically stable to d/c.   Consultants:   Pulmonology  Procedures: None Antimicrobials: Anti-infectives (From admission, onward)   Start     Dose/Rate Route Frequency Ordered Stop   02/11/20 1000  azithromycin (ZITHROMAX) tablet 500 mg        500 mg Oral Daily 02/10/20 1542     02/10/20 2200  sulfamethoxazole-trimethoprim (BACTRIM) 400-80 MG per tablet 1 tablet        1 tablet Oral 2 times daily 02/10/20 1845     02/10/20 1300  azithromycin (ZITHROMAX) tablet 500 mg        500 mg Oral  Once 02/10/20 1259 02/10/20 1320  Subjective: Patient was seen and examined at bedside.  He reports feeling better but still has a lot of coughing.  Still has increased respiratory rate.  He denies any chest pain.  Objective: Vitals:   02/10/20 2200 02/11/20 0202 02/11/20 0700 02/11/20 1159  BP: 134/71 119/66 (!) 98/56 120/72  Pulse: (!) 106 91 98 99  Resp: (!) 22 (!) 24 20 18   Temp:  97.6 F (36.4 C) 97.6 F (36.4 C) 97.6 F (36.4  C)  TempSrc:    Oral  SpO2:  96% 99% 100%  Weight:      Height:        Intake/Output Summary (Last 24 hours) at 02/11/2020 1339 Last data filed at 02/11/2020 0900 Gross per 24 hour  Intake 1550.03 ml  Output 1750 ml  Net -199.97 ml   Filed Weights   02/10/20 2000  Weight: 68.7 kg    Examination:  General exam: Appears calm and comfortable , not appears in any distress. Respiratory system: Clear to auscultation. Respiratory effort normal. Cardiovascular system: S1 & S2 heard, RRR. No JVD, murmurs, rubs, gallops or clicks. No pedal edema. Gastrointestinal system: Abdomen is nondistended, soft and nontender. No organomegaly or masses felt. Normal bowel sounds heard. Central nervous system: Alert and oriented. No focal neurological deficits. Extremities: Symmetric 5 x 5 power. Skin: No rashes, lesions or ulcers Psychiatry: Judgement and insight appear normal. Mood & affect appropriate.     Data Reviewed: I have personally reviewed following labs and imaging studies  CBC: Recent Labs  Lab 02/10/20 1055 02/11/20 0549  WBC 19.6* 13.5*  HGB 12.9* 11.4*  HCT 37.6* 32.6*  MCV 86.0 87.4  PLT 435* [id number]   Basic Metabolic Panel: Recent Labs  Lab 02/10/20 1055 02/10/20 1901 02/10/20 2213 02/11/20 0549  NA 118* 121* 126* 129*  K 4.6 5.1 4.8 4.7  CL 86* 89* 93* 97*  CO2 23 24 22 24   GLUCOSE 122* 148* 159* 121*  BUN 13 11 11 8   CREATININE 0.52* 0.60* 0.53* 0.51*  CALCIUM 8.6* 8.3* 8.3* 8.5*   GFR: Estimated Creatinine Clearance: 93 mL/min (A) (by C-G formula based on SCr of 0.51 mg/dL (L)). Liver Function Tests: Recent Labs  Lab 02/11/20 0549  AST 18  ALT 19  ALKPHOS 47  BILITOT 0.8  PROT 5.8*  ALBUMIN 2.5*   No results for input(s): LIPASE, AMYLASE in the last 168 hours. No results for input(s): AMMONIA in the last 168 hours. Coagulation Profile: No results for input(s): INR, PROTIME in the last 168 hours. Cardiac Enzymes: No results for input(s):  CKTOTAL, CKMB, CKMBINDEX, TROPONINI in the last 168 hours. BNP (last 3 results) No results for input(s): PROBNP in the last 8760 hours. HbA1C: No results for input(s): HGBA1C in the last 72 hours. CBG: No results for input(s): GLUCAP in the last 168 hours. Lipid Profile: No results for input(s): CHOL, HDL, LDLCALC, TRIG, CHOLHDL, LDLDIRECT in the last 72 hours. Thyroid Function Tests: No results for input(s): TSH, T4TOTAL, FREET4, T3FREE, THYROIDAB in the last 72 hours. Anemia Panel: No results for input(s): VITAMINB12, FOLATE, FERRITIN, TIBC, IRON, RETICCTPCT in the last 72 hours. Sepsis Labs: Recent Labs  Lab 02/10/20 1901  PROCALCITON 0.12    Recent Results (from the past 240 hour(s))  Culture, blood (Routine X 2) w Reflex to ID Panel     Status: None (Preliminary result)   Collection Time: 02/10/20  1:13 PM   Specimen: BLOOD  Result Value Ref Range Status   Specimen Description  BLOOD RIGHT ANTECUBITAL  Final   Special Requests   Final    BOTTLES DRAWN AEROBIC AND ANAEROBIC Blood Culture results may not be optimal due to an inadequate volume of blood received in culture bottles   Culture   Final    NO GROWTH < 24 HOURS Performed at [facility], [address], [place], [place] [address]    Report Status PENDING  Incomplete  Culture, blood (Routine X 2) w Reflex to ID Panel     Status: None (Preliminary result)   Collection Time: 02/10/20  1:13 PM   Specimen: BLOOD  Result Value Ref Range Status   Specimen Description BLOOD LEFT ANTECUBITAL  Final   Special Requests   Final    BOTTLES DRAWN AEROBIC AND ANAEROBIC Blood Culture adequate volume   Culture   Final    NO GROWTH < 24 HOURS Performed at [facility], [address], [place], [place] [address]    Report Status PENDING  Incomplete  Resp Panel by RT-PCR (Flu A&B, Covid) Nasopharyngeal Swab     Status: None   Collection Time: 02/10/20  3:19 PM   Specimen: Nasopharyngeal Swab;  Nasopharyngeal(NP) swabs in vial transport medium  Result Value Ref Range Status   SARS Coronavirus 2 by RT PCR NEGATIVE NEGATIVE Final    Comment: (NOTE) SARS-CoV-2 target nucleic acids are NOT DETECTED.  The SARS-CoV-2 RNA is generally detectable in upper respiratory specimens during the acute phase of infection. The lowest concentration of SARS-CoV-2 viral copies this assay can detect is 138 copies/mL. A negative result does not preclude SARS-Cov-2 infection and should not be used as the sole basis for treatment or other patient management decisions. A negative result may occur with  improper specimen collection/handling, submission of specimen other than nasopharyngeal swab, presence of viral mutation(s) within the areas targeted by this assay, and inadequate number of viral copies(<138 copies/mL). A negative result must be combined with clinical observations, patient history, and epidemiological information. The expected result is Negative.  Fact Sheet for Patients:  EntrepreneurPulse.com.au  Fact Sheet for Healthcare Providers:  IncredibleEmployment.be  This test is no t yet approved or cleared by the [place] FDA and  has been authorized for detection and/or diagnosis of SARS-CoV-2 by FDA under an Emergency Use Authorization (EUA). This EUA will remain  in effect (meaning this test can be used) for the duration of the COVID-19 declaration under Section 564(b)(1) of the Act, 21 U.S.C.section 360bbb-3(b)(1), unless the authorization is terminated  or revoked sooner.       Influenza A by PCR NEGATIVE NEGATIVE Final   Influenza B by PCR NEGATIVE NEGATIVE Final    Comment: (NOTE) The Xpert Xpress SARS-CoV-2/FLU/RSV plus assay is intended as an aid in the diagnosis of influenza from Nasopharyngeal swab specimens and should not be used as a sole basis for treatment. Nasal washings and aspirates are unacceptable for Xpert Xpress  SARS-CoV-2/FLU/RSV testing.  Fact Sheet for Patients: EntrepreneurPulse.com.au  Fact Sheet for Healthcare Providers: IncredibleEmployment.be  This test is not yet approved or cleared by the [place] FDA and has been authorized for detection and/or diagnosis of SARS-CoV-2 by FDA under an Emergency Use Authorization (EUA). This EUA will remain in effect (meaning this test can be used) for the duration of the COVID-19 declaration under Section 564(b)(1) of the Act, 21 U.S.C. section 360bbb-3(b)(1), unless the authorization is terminated or revoked.  Performed at [facility], [address] [place], [place] [address]  Radiology Studies: DG Chest 2 View  Result Date: 02/10/2020 CLINICAL DATA:  Shortness of breath. Chest pain with deep inspiration. Home oxygen use. EXAM: CHEST - 2 VIEW COMPARISON:  11/12/2019.  CTA chest 11/06/2019. FINDINGS: Midline trachea. Mild cardiomegaly. No pleural effusion or pneumothorax. Biapical pleural thickening. Basilar predominant interstitial opacities are similar. Relative sparing of the lung apices. IMPRESSION: Similar basilar predominant interstitial opacities. When compared to 11/06/2019 CT, this is primarily felt to represent post infectious/inflammatory fibrosis. Superimposed acute/subacute pneumonia cannot be excluded. Electronically Signed   By: Abigail Miyamoto M.D.   On: 02/10/2020 11:41        Scheduled Meds: . ALPRAZolam  0.5 mg Oral TID  . amLODipine  5 mg Oral Daily  . azithromycin  500 mg Oral Daily  . enoxaparin (LOVENOX) injection  40 mg Subcutaneous Q24H  . escitalopram  20 mg Oral Daily  . fluticasone furoate-vilanterol  1 puff Inhalation Daily   And  . umeclidinium bromide  1 puff Inhalation Daily  . methylPREDNISolone (SOLU-MEDROL) injection  40 mg Intravenous Q12H  . metoprolol tartrate  50 mg Oral BID  . sodium chloride flush  3 mL Intravenous Q12H  . sodium  chloride  1 g Oral TID WC  . sulfamethoxazole-trimethoprim  1 tablet Oral BID   Continuous Infusions: . sodium chloride Stopped (02/11/20 1131)     LOS: 1 day    Time spent: 35 mins    Truda Staub, MD [facility]   If 7PM-7AM, please contact night-coverage

## 2020-02-12 DIAGNOSIS — J9601 Acute respiratory failure with hypoxia: Secondary | ICD-10-CM | POA: Diagnosis not present

## 2020-02-12 DIAGNOSIS — J849 Interstitial pulmonary disease, unspecified: Secondary | ICD-10-CM | POA: Diagnosis not present

## 2020-02-12 DIAGNOSIS — J441 Chronic obstructive pulmonary disease with (acute) exacerbation: Secondary | ICD-10-CM | POA: Diagnosis not present

## 2020-02-12 LAB — BASIC METABOLIC PANEL
Anion gap: 7 (ref 5–15)
BUN: 14 mg/dL (ref 8–23)
CO2: 30 mmol/L (ref 22–32)
Calcium: 8.6 mg/dL — ABNORMAL LOW (ref 8.9–10.3)
Chloride: 94 mmol/L — ABNORMAL LOW (ref 98–111)
Creatinine, Ser: 0.66 mg/dL (ref 0.61–1.24)
GFR, Estimated: >60 mL/min (ref >60)
Glucose, Bld: 126 mg/dL — ABNORMAL HIGH (ref 70–99)
Potassium: 4.6 mmol/L (ref 3.5–5.1)
Sodium: 131 mmol/L — ABNORMAL LOW (ref 135–145)

## 2020-02-12 LAB — CBC
HCT: 33.8 % — ABNORMAL LOW (ref 39.0–52.0)
Hemoglobin: 11.2 g/dL — ABNORMAL LOW (ref 13.0–17.0)
MCH: 29.6 pg (ref 26.0–34.0)
MCHC: 33.1 g/dL (ref 30.0–36.0)
MCV: 89.2 fL (ref 80.0–100.0)
Platelets: 407 10*3/uL — ABNORMAL HIGH (ref 150–400)
RBC: 3.79 MIL/uL — ABNORMAL LOW (ref 4.22–5.81)
RDW: 13.8 % (ref 11.5–15.5)
WBC: 18.6 10*3/uL — ABNORMAL HIGH (ref 4.0–10.5)
nRBC: 0 % (ref 0.0–0.2)

## 2020-02-12 LAB — EXPECTORATED SPUTUM ASSESSMENT W GRAM STAIN, RFLX TO RESP C

## 2020-02-12 LAB — PHOSPHORUS: Phosphorus: 2.8 mg/dL (ref 2.5–4.6)

## 2020-02-12 LAB — MAGNESIUM: Magnesium: 2.1 mg/dL (ref 1.7–2.4)

## 2020-02-12 MED ORDER — ENSURE ENLIVE PO LIQD
237.0000 mL | Freq: Two times a day (BID) | ORAL | Status: DC
  Administered 2020-02-13 – 2020-02-15 (×5): 237 mL via ORAL

## 2020-02-12 MED ORDER — GUAIFENESIN-DM 100-10 MG/5ML PO SYRP
5.0000 mL | ORAL_SOLUTION | ORAL | Status: DC | PRN
  Administered 2020-02-12 – 2020-02-15 (×5): 5 mL via ORAL
  Filled 2020-02-12 (×5): qty 5

## 2020-02-12 MED ORDER — METHYLPREDNISOLONE SODIUM SUCC 40 MG IJ SOLR
40.0000 mg | INTRAMUSCULAR | Status: DC
  Administered 2020-02-13 – 2020-02-14 (×2): 40 mg via INTRAVENOUS
  Filled 2020-02-12 (×3): qty 1

## 2020-02-12 MED ORDER — SODIUM CHLORIDE 1 G PO TABS
1.0000 g | ORAL_TABLET | Freq: Two times a day (BID) | ORAL | Status: AC
  Administered 2020-02-12 – 2020-02-13 (×2): 1 g via ORAL
  Filled 2020-02-12 (×2): qty 1

## 2020-02-12 NOTE — Progress Notes (Signed)
Pulmonary Medicine          Date: 02/12/2020,   MRN# [id number] Austin Kim [birth year]     AdmissionWeight: 68.7 kg                 CurrentWeight: 68.7 kg   Referring physician Dr. Jacqualine Code   CHIEF COMPLAINT:   Acute exacerbation of interstitial lung disease.   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  Seen in outpatient pulmonary clinic with mild improvement had additional evaluation at [facility] with ILD clinic thought to have possible ILD flare and perhaps initiation of antifibrotic therapy at some point.  States he had 1 day of diarrhea 1 day prior to admission no fevers, denies hemoptysis, still has chronic cough which is dry with costochondritis.  He is also noted to have hyponatremia at 118 which is worsened from previous which was 128 in September during his admission.  He did admit to taking multiple doses of NSAIDs including naproxen twice daily.  And recently started Lexapro due to ongoing general anxiety disorder refractory to Valium at home.  He had chest x-ray done here which is improved compared to previous in September mostly with interstitial basal predominant opacities consistent with known pulmonary fibrosis.  There is no pneumothorax, no consolidated infiltrate, no signs of pleural effusion, pulmonary edema.  Blood work is not consistent with infection, pulmonary consultation placed for additional management of ILD flare.   -02/12/20- patient is improved. We will decrease steroids to once daily 40mg .  Have dcd all  narcotics today.  He was unable to sleep last night.   PAST MEDICAL HISTORY   Past Medical History:  Diagnosis Date  . Blindness of right eye   . COPD (chronic obstructive pulmonary disease) ([place])   . Elevated lipids   . Hx of basal cell carcinoma 02/05/2010   L temple  . Hx of dysplastic nevus 04/12/2019   R spinal mid back, moderate to severe atypia, excised 05/09/2019  . Hypertension   . Spontaneous pneumothorax   . Squamous cell carcinoma of skin 04/12/2019   Posterior Crown, SCC IS     SURGICAL HISTORY   Past Surgical History:  Procedure Laterality Date  . BASAL CELL CARCINOMA EXCISION Bilateral   . COLONOSCOPY WITH PROPOFOL N/A 07/28/2017   Procedure: COLONOSCOPY WITH PROPOFOL;  Surgeon: Lollie Sails, MD;  Location: [facility] ENDOSCOPY;  Service: Endoscopy;  Laterality: N/A;  . EYE SURGERY    . PLEURAL SCARIFICATION       FAMILY HISTORY   Family History  Problem Relation Age of Onset  . Diabetes Father   . CAD Father   . Colon cancer Father   . Heart disease Father        Heart transplant  . Diabetes Brother        Type 1 DM     SOCIAL HISTORY   Social History   Tobacco Use  . Smoking status: Former Smoker    Quit date: 2001    Years since quitting: 20.9  . Smokeless tobacco: Never Used  . Tobacco comment: 2  PPD for ~20 years  Vaping Use  . Vaping Use: Never used  Substance Use Topics  . Alcohol use: Yes  . Drug use: No     MEDICATIONS    Home Medication:    Current Medication:  Current Facility-Administered Medications:  .  ALPRAZolam Prudy Feeler) tablet 0.5 mg, 0.5 mg, Oral, TID, Micaiah Litle, MD, 0.5 mg at 02/12/20 1052 .  amLODipine (NORVASC) tablet 5 mg, 5 mg, Oral, Daily, Venora Maples, MD, 5 mg at 02/12/20 1052 .  azithromycin (ZITHROMAX) tablet 500 mg, 500 mg, Oral, Daily, Venora Maples, MD, 500 mg at 02/12/20 1052 .  enoxaparin (LOVENOX) injection 40 mg, 40 mg, Subcutaneous, Q24H, Venora Maples, MD, 40 mg at  02/11/20 2107 .  escitalopram (LEXAPRO) tablet 20 mg, 20 mg, Oral, Daily, Venora Maples, MD, 20 mg at 02/12/20 1052 .  fluticasone furoate-vilanterol (BREO ELLIPTA) 100-25 MCG/INH 1 puff, 1 puff, Inhalation, Daily, 1 puff at 02/12/20 1053 **AND** umeclidinium bromide (INCRUSE ELLIPTA) 62.5 MCG/INH 1 puff, 1 puff, Inhalation, Daily, Venora Maples, MD, 1 puff at 02/12/20 1053 .  [START ON 02/13/2020] methylPREDNISolone sodium succinate (SOLU-MEDROL) 40 mg/mL injection 40 mg, 40 mg, Intravenous, Q24H, Tanisha Lutes, MD .  metoprolol tartrate (LOPRESSOR) tablet 50 mg, 50 mg, Oral, BID, Venora Maples, MD, 50 mg at 02/12/20 1052 .  morphine 2 MG/ML injection 1 mg, 1 mg, Intravenous, Q3H PRN, Karna Christmas, Rilley Stash, MD .  ondansetron (ZOFRAN) tablet 4 mg, 4 mg, Oral, Q6H PRN **OR** ondansetron (ZOFRAN) injection 4 mg, 4 mg, Intravenous, Q6H PRN, Venora Maples, MD .  sodium chloride flush (NS) 0.9 % injection 3 mL, 3 mL, Intravenous, Q12H, Venora Maples, MD, 3 mL at 02/12/20 1056 .  sodium chloride tablet 1 g, 1 g, Oral, BID WC, Artrell Lawless, MD .  sulfamethoxazole-trimethoprim (BACTRIM) 400-80 MG per tablet 1 tablet, 1 tablet, Oral, BID, Venora Maples, MD, 1 tablet at 02/12/20 1053 .  traZODone (DESYREL) tablet 50 mg, 50 mg, Oral, QHS PRN, Venora Maples, MD, 50 mg at 02/12/20 7425    ALLERGIES   Patient has no known allergies.     REVIEW OF SYSTEMS    Review of Systems:  Gen:  Denies  fever, sweats, chills weigh loss  HEENT: Denies blurred vision, double vision, ear pain, eye pain, hearing loss, nose bleeds, sore throat Cardiac:  No dizziness, chest pain or heaviness, chest tightness,edema Resp: reports cough chronically   Gi: reports diarreah  Gu:  Reports new onset dribbling of urine Ext:   Denies Joint pain, stiffness or swelling Skin: Denies  skin rash, easy bruising or bleeding or hives Endoc:  Denies polyuria, polydipsia , polyphagia or weight  change Psych:   Reports severe anxiety chronically Other:  All other systems negative   VS: BP 118/75 (BP Location: Left Arm)   Pulse 80   Temp 97.8 F (36.6 C) (Oral)   Resp 20   Ht 5\' 9"  (1.753 m)   Wt 68.7 kg   SpO2 98%   BMI 22.37 kg/m      PHYSICAL EXAM    GENERAL:NAD, no fevers, chills, no weakness no fatigue HEAD: Normocephalic, atraumatic.  EYES: Pupils equal, round, reactive to light. Extraocular muscles intact. No scleral icterus.  MOUTH: Moist mucosal membrane. Dentition intact. No abscess noted.  EAR, NOSE, THROAT: Clear without exudates. No external lesions.  NECK: Supple. No thyromegaly. No nodules. No JVD.  PULMONARY: velcro crepitations at bases bilaterally  CARDIOVASCULAR: S1 and  S2. Regular rate and rhythm. No murmurs, rubs, or gallops. No edema. Pedal pulses 2+ bilaterally.  GASTROINTESTINAL: Soft, nontender, nondistended. No masses. Positive bowel sounds. No hepatosplenomegaly.  MUSCULOSKELETAL: No swelling, clubbing, or edema. Range of motion full in all extremities.  NEUROLOGIC: Cranial nerves II through XII are intact. No gross focal neurological deficits. Sensation intact. Reflexes intact.  SKIN: No ulceration, lesions, rashes, or cyanosis. Skin warm and dry. Turgor intact.  PSYCHIATRIC: Mood, affect within normal limits. The patient is awake, alert and oriented x 3. Insight, judgment intact.       IMAGING    DG Chest 2 View  Result Date: 02/10/2020 CLINICAL DATA:  Shortness of breath. Chest pain with deep inspiration. Home oxygen use. EXAM: CHEST - 2 VIEW COMPARISON:  11/12/2019.  CTA chest 11/06/2019. FINDINGS: Midline trachea. Mild cardiomegaly. No pleural effusion or pneumothorax. Biapical pleural thickening. Basilar predominant interstitial opacities are similar. Relative sparing of the lung apices. IMPRESSION: Similar basilar predominant interstitial opacities. When compared to 11/06/2019 CT, this is primarily felt to represent post  infectious/inflammatory fibrosis. Superimposed acute/subacute pneumonia cannot be excluded. Electronically Signed   By: Abigail Miyamoto M.D.   On: 02/10/2020 11:41      ASSESSMENT/PLAN   Acute exacerbation of interstitial lung disease   - patient is with stable CXR with no interval changes from previous done this month on outpatient in pulmonary clinic.   - patient is at home O2 setting - 3L [place]  - he respnoded well to IV solumedrol during previous exacerbation and has received solumedrol on presentation to ED. - significant dyspnea and breathlessness - will add low dose IV morphine  -has been on bactrim for PCP ppx and unlikely has bacterial superinfection.  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 - patient has been taking several doses of NSAIDS daily, he does have increased H2O intake and low Na intake PO as well as initiaion of lexapro on outpatient all of which are likely contributing to hyponatremia - have added TID salt tabs with meals  - patient has poor po intake  -repeat Urine Na   Generalized anxiety disorder  - s/p Valium on outpatient with recent tapering off which may have caused recurrence of anxiety symptoms  - have added 0.5 TID alprazolam PO  - consider psychiatric evaluation    Combined pulmonary fibrosis and emphysema (CPFE)   Patient with bullous emphysema - he is high risk for pneumothorax with this froceful dry cough, id prefer patient be on some anti-tussive to prevent worsening costochondritis, pneumothorax, and rarely rib and sternal fracture.   - he is on PRN morphine 1mg  and I will add scheduled Hycodan 79ml     Thank you for allowing me to participate in the care of this patient.   Patient/Family are satisfied with care plan and all questions have been answered.  This document was prepared using Dragon  voice recognition software and may include unintentional dictation errors.     Ottie Glazier, M.D.  Division of [facility]

## 2020-02-12 NOTE — Progress Notes (Signed)
New sputum sample sent to lab at this time per MD order

## 2020-02-12 NOTE — Progress Notes (Addendum)
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  Patient has been following up with pulmonary clinic at [facility] clinic where extensive work-up has been conducted.  Patient has been on chronic steroids with good response,  while being on the steroids symptoms remains under good control.  His symptoms get worse as his steroids get tapering off.  He is also been on Bactrim prophylaxis. Patient is admitted for acute on chronic hypoxic respiratory failure.  Pulmonology was consulted,  recommended IV steroids Zithromax. Anxiety medications, IV morphine.  Assessment & Plan:   Active Problems:   ILD (interstitial lung disease) ([place])   Hypertension   Hyponatremia   COPD with acute exacerbation (HCC)   Acute hypoxemic respiratory failure (HCC)   Acute on chronic hypoxemic respiratory failure due to interstitial lung disease and COPD:  Patient presenting with 1 week hx. of acute on chronic worsening dyspnea as well as increased oxygen requirement.   Work-up so far unrevealing, Chest x-ray unremarkable, suspect symptoms are due to worsening of his underlying ILD.  Dr. Lanney Gins from pulmonary has been consulted . Continue methylprednisolone and azithromycin per Pulm Continue Trelegy Ellipta, nebs as needed. Continue Bactrim  Recommended morphine for for dyspnea, anxiety medication, antitussive medication. Covid test negative.  Legionella antigen is negative. Patient seems improved, states cough is better, breathing is better, been using incentive spirometer.  # Hyponatremia: >> Improving . Suspect this could be secondary to hypovolemia,  Patient reports poor p.o. intake for last 1 week. Continue salt tablets 2 g 3  times daily.   Continue normal saline for 24 hours. Follow-up urine sodium, urine osmolality, serum osmolality Trend sodium every 6 hours for time being  # Leukocytosis: This could be secondary to underlying infection,  also could be due to chronic steroid use. Trend WBC curve.  # BPH  with LUTS: Consider flomax.  Poor appetite- likely multifactorial in setting of interstitial lung disease and chronic illness, consult nutrition.  Anxiety- Continue Lexapro, continue Xanax 0.5 mg 3 times daily.  Hypertension-Continue amlodipine, metoprolol   DVT prophylaxis: Lovenox  Code Status: Full code Family Communication: Wife at bedside  Disposition Plan:  Status is: Inpatient  Remains inpatient appropriate because:Inpatient level of care appropriate due to severity of illness   Dispo: The patient is from: Home              Anticipated d/c is to: Home              Anticipated d/c date is: > 3 days              Patient currently is medically stable to d/c.   Consultants:   Pulmonology  Procedures: None Antimicrobials: Anti-infectives (From admission, onward)   Start     Dose/Rate Route Frequency Ordered Stop   02/11/20 1000  azithromycin (ZITHROMAX) tablet 500 mg        500 mg Oral Daily 02/10/20 1542     02/10/20 2200  sulfamethoxazole-trimethoprim (BACTRIM) 400-80 MG per tablet 1 tablet        1 tablet Oral 2 times daily 02/10/20 1845     02/10/20 1300  azithromycin (ZITHROMAX) tablet 500 mg        500 mg Oral  Once 02/10/20 1259  02/10/20 1320     Subjective: Patient was seen and examined at bedside.  He reports feeling much improved, he reports cough is better,  breathing is improved.  He denies any chest pain.  Objective: Vitals:   02/11/20 2051 02/12/20 0543 02/12/20 0701 02/12/20 0811  BP: 137/74 132/70  135/76  Pulse: 96 85  89  Resp: 20   18  Temp: 97.8 F (36.6 C) 98 F (36.7 C)  97.8 F (36.6 C)  TempSrc: Oral Oral  Oral  SpO2: 97% 97% 98% 98%   Weight:      Height:        Intake/Output Summary (Last 24 hours) at 02/12/2020 1130 Last data filed at 02/12/2020 1025 Gross per 24 hour  Intake 600 ml  Output 2400 ml  Net -1800 ml   Filed Weights   02/10/20 2000  Weight: 68.7 kg    Examination:  General exam: Appears calm and comfortable , not appears in any distress. Respiratory system: Clear to auscultation. Respiratory effort normal. Cardiovascular system: S1 & S2 heard, RRR. No JVD, murmurs, rubs, gallops or clicks. No pedal edema. Gastrointestinal system: Abdomen is nondistended, soft and nontender. No organomegaly or masses felt. Normal bowel sounds heard. Central nervous system: Alert and oriented. No focal neurological deficits. Extremities: No edema, no cyanosis, no clubbing. Skin: No rashes, lesions or ulcers Psychiatry: Judgement and insight appear normal. Mood & affect appropriate.     Data Reviewed: I have personally reviewed following labs and imaging studies  CBC: Recent Labs  Lab 02/10/20 1055 02/11/20 0549 02/12/20 0327  WBC 19.6* 13.5* 18.6*  HGB 12.9* 11.4* 11.2*  HCT 37.6* 32.6* 33.8*  MCV 86.0 87.4 89.2  PLT 435* 367 010*   Basic Metabolic Panel: Recent Labs  Lab 02/10/20 1055 02/10/20 1901 02/10/20 2213 02/11/20 0549 02/12/20 0327  NA 118* 121* 126* 129* 131*  K 4.6 5.1 4.8 4.7 4.6  CL 86* 89* 93* 97* 94*  CO2 23 24 22 24 30   GLUCOSE 122* 148* 159* 121* 126*  BUN 13 11 11 8 14   CREATININE 0.52* 0.60* 0.53* 0.51* 0.66  CALCIUM 8.6* 8.3* 8.3* 8.5* 8.6*  MG  --   --   --   --  2.1  PHOS  --   --   --   --  2.8   GFR: Estimated Creatinine Clearance: 93 mL/min (by C-G formula based on SCr of 0.66 mg/dL). Liver Function Tests: Recent Labs  Lab 02/11/20 0549  AST 18  ALT 19  ALKPHOS 47  BILITOT 0.8  PROT 5.8*  ALBUMIN 2.5*   No results for input(s): LIPASE, AMYLASE in the last 168 hours. No results for input(s): AMMONIA in the last 168 hours. Coagulation Profile: No  results for input(s): INR, PROTIME in the last 168 hours. Cardiac Enzymes: No results for input(s): CKTOTAL, CKMB, CKMBINDEX, TROPONINI in the last 168 hours. BNP (last 3 results) No results for input(s): PROBNP in the last 8760 hours. HbA1C: No results for input(s): HGBA1C in the last 72 hours. CBG: No results for input(s): GLUCAP in the last 168 hours. Lipid Profile: No results for input(s): CHOL, HDL, LDLCALC, TRIG, CHOLHDL, LDLDIRECT in the last 72 hours. Thyroid Function Tests: No results for input(s): TSH, T4TOTAL, FREET4, T3FREE, THYROIDAB in the last 72 hours. Anemia Panel: No results for input(s): VITAMINB12, FOLATE, FERRITIN, TIBC, IRON, RETICCTPCT in the last 72 hours. Sepsis Labs: Recent Labs  Lab 02/10/20 1901  PROCALCITON 0.12    Recent Results (from  the past 240 hour(s))  Culture, blood (Routine X 2) w Reflex to ID Panel     Status: None (Preliminary result)   Collection Time: 02/10/20  1:13 PM   Specimen: BLOOD  Result Value Ref Range Status   Specimen Description BLOOD RIGHT ANTECUBITAL  Final   Special Requests   Final    BOTTLES DRAWN AEROBIC AND ANAEROBIC Blood Culture results may not be optimal due to an inadequate volume of blood received in culture bottles   Culture   Final    NO GROWTH 2 DAYS Performed at [facility], [address] [place], [place] [address]    Report Status PENDING  Incomplete  Culture, blood (Routine X 2) w Reflex to ID Panel     Status: None (Preliminary result)   Collection Time: 02/10/20  1:13 PM   Specimen: BLOOD  Result Value Ref Range Status   Specimen Description BLOOD LEFT ANTECUBITAL  Final   Special Requests   Final    BOTTLES DRAWN AEROBIC AND ANAEROBIC Blood Culture adequate volume   Culture   Final    NO GROWTH 2 DAYS Performed at [facility], [address] [place], [place] [address]    Report Status PENDING  Incomplete  Resp Panel by RT-PCR (Flu A&B, Covid) Nasopharyngeal Swab     Status:  None   Collection Time: 02/10/20  3:19 PM   Specimen: Nasopharyngeal Swab; Nasopharyngeal(NP) swabs in vial transport medium  Result Value Ref Range Status   SARS Coronavirus 2 by RT PCR NEGATIVE NEGATIVE Final    Comment: (NOTE) SARS-CoV-2 target nucleic acids are NOT DETECTED.  The SARS-CoV-2 RNA is generally detectable in upper respiratory specimens during the acute phase of infection. The lowest concentration of SARS-CoV-2 viral copies this assay can detect is 138 copies/mL. A negative result does not preclude SARS-Cov-2 infection and should not be used as the sole basis for treatment or other patient management decisions. A negative result may occur with  improper specimen collection/handling, submission of specimen other than nasopharyngeal swab, presence of viral mutation(s) within the areas targeted by this assay, and inadequate number of viral copies(<138 copies/mL). A negative result must be combined with clinical observations, patient history, and epidemiological information. The expected result is Negative.  Fact Sheet for Patients:  EntrepreneurPulse.com.au  Fact Sheet for Healthcare Providers:  IncredibleEmployment.be  This test is no t yet approved or cleared by the [place] FDA and  has been authorized for detection and/or diagnosis of SARS-CoV-2 by FDA under an Emergency Use Authorization (EUA). This EUA will remain  in effect (meaning this test can be used) for the duration of the COVID-19 declaration under Section 564(b)(1) of the Act, 21 U.S.C.section 360bbb-3(b)(1), unless the authorization is terminated  or revoked sooner.       Influenza A by PCR NEGATIVE NEGATIVE Final   Influenza B by PCR NEGATIVE NEGATIVE Final    Comment: (NOTE) The Xpert Xpress SARS-CoV-2/FLU/RSV plus assay is intended as an aid in the diagnosis of influenza from Nasopharyngeal swab specimens and should not be used as a sole basis for  treatment. Nasal washings and aspirates are unacceptable for Xpert Xpress SARS-CoV-2/FLU/RSV testing.  Fact Sheet for Patients: EntrepreneurPulse.com.au  Fact Sheet for Healthcare Providers: IncredibleEmployment.be  This test is not yet approved or cleared by the [place] FDA and has been authorized for detection and/or diagnosis of SARS-CoV-2 by FDA under an Emergency Use Authorization (EUA). This EUA will remain in effect (meaning this test can  be used) for the duration of the COVID-19 declaration under Section 564(b)(1) of the Act, 21 U.S.C. section 360bbb-3(b)(1), unless the authorization is terminated or revoked.  Performed at [facility], [address] [place], [place] [address]   Urine Culture     Status: Abnormal   Collection Time: 02/10/20  4:08 PM   Specimen: Urine, Random  Result Value Ref Range Status   Specimen Description   Final    URINE, RANDOM Performed at [facility], [address] [place], [place] [address]    Special Requests   Final    NONE Performed at [facility], [address] [place], [place] [address]    Culture (A)  Final    <10,000 COLONIES/mL INSIGNIFICANT GROWTH Performed at [facility], [place] [address] [place], [place] [address]    Report Status 02/11/2020 FINAL  Final    Radiology Studies: No results found.   Scheduled Meds: . ALPRAZolam  0.5 mg Oral TID  . amLODipine  5 mg Oral Daily  . azithromycin  500 mg Oral Daily  . enoxaparin (LOVENOX) injection  40 mg Subcutaneous Q24H  . escitalopram  20 mg Oral Daily  . fluticasone furoate-vilanterol  1 puff Inhalation Daily   And  . umeclidinium bromide  1 puff Inhalation Daily  . methylPREDNISolone (SOLU-MEDROL) injection  40 mg Intravenous Q12H  . metoprolol tartrate  50 mg Oral BID  . sodium chloride flush  3 mL Intravenous Q12H  . sodium chloride  1 g Oral BID WC  . sulfamethoxazole-trimethoprim   1 tablet Oral BID   Continuous Infusions:    LOS: 2 days    Time spent: 25 mins    Shawna Clamp, MD [facility]   If 7PM-7AM, please contact night-coverage

## 2020-02-12 NOTE — Progress Notes (Signed)
I received phone call from lab stating the sent sputum sample was no good.  A new specimen cup was given to patient and patient instructed to notify nursing staff once a specimen is produced.

## 2020-02-12 NOTE — Progress Notes (Signed)
Initial Nutrition Assessment  DOCUMENTATION CODES:   Not applicable  INTERVENTION:  Ensure Enlive po BID, each supplement provides 350 kcal and 20 grams of protein   NUTRITION DIAGNOSIS:   Increased nutrient needs related to chronic illness (COPD, ILD) as evidenced by estimated needs.    GOAL:   Patient will meet greater than or equal to 90% of their needs    MONITOR:   I & O's,Supplement acceptance,PO intake,Weight trends  REASON FOR ASSESSMENT:   Consult Poor PO  ASSESSMENT:  64 year old male with history of emphysematous COPD on home O2, interstitial lung disease possibly secondary to post Covid fibrosis, HTN, hyponatremia, right eye blindness, presents with 1 week of worsening SOB. Patient admitted with acute on chronic respiratory failure secondary to ILD.  RD working remotely.  Pulmonary consulted and following. CXR with no interval changes from previous earlier this month at outpatient pulmonary clinic. Noted improved today, decreasing steroids to 40 mg daily.   Patient with good appetite, eating 90-100% x 4 documented intakes 12/25-12/26. Patient with increased needs secondary to COPD and ILD. Will add Ensure supplements to help him meet his needs.   Per chart, weights have trended down 8.6 lbs (5.4%) in the last 3 months which is insignificant for time frame.  Medications reviewed and include: Xanax, Zithromax, Lexapro, Methylprednisolone, Bactrim  Labs: Na 131 (L), WBC 18.6 (H) K/Mg/P - WNL  NUTRITION - FOCUSED PHYSICAL EXAM: Unable to complete at this time, RD working remotely.   Diet Order:   Diet Order            Diet Heart Room service appropriate? Yes; Fluid consistency: Thin  Diet effective now                 EDUCATION NEEDS:   No education needs have been identified at this time  Skin:  Skin Assessment: Reviewed RN Assessment  Last BM:  12/24  Height:   Ht Readings from Last 1 Encounters:  02/10/20 5\' 9"  (1.753 m)    Weight:    Wt Readings from Last 1 Encounters:  02/10/20 68.7 kg    BMI:  Body mass index is 22.37 kg/m.  Estimated Nutritional Needs:   Kcal:  2100-2300  Protein:  105-115  Fluid:  >/= 2 L/day   Lajuan Lines, RD, LDN Clinical Nutrition After Hours/Weekend Pager # in [place]

## 2020-02-13 DIAGNOSIS — J849 Interstitial pulmonary disease, unspecified: Secondary | ICD-10-CM | POA: Diagnosis not present

## 2020-02-13 DIAGNOSIS — J441 Chronic obstructive pulmonary disease with (acute) exacerbation: Secondary | ICD-10-CM | POA: Diagnosis not present

## 2020-02-13 DIAGNOSIS — J9601 Acute respiratory failure with hypoxia: Secondary | ICD-10-CM | POA: Diagnosis not present

## 2020-02-13 LAB — CBC
HCT: 32.6 % — ABNORMAL LOW (ref 39.0–52.0)
Hemoglobin: 10.8 g/dL — ABNORMAL LOW (ref 13.0–17.0)
MCH: 29.8 pg (ref 26.0–34.0)
MCHC: 33.1 g/dL (ref 30.0–36.0)
MCV: 90.1 fL (ref 80.0–100.0)
Platelets: 362 10*3/uL (ref 150–400)
RBC: 3.62 MIL/uL — ABNORMAL LOW (ref 4.22–5.81)
RDW: 13.8 % (ref 11.5–15.5)
WBC: 16.8 10*3/uL — ABNORMAL HIGH (ref 4.0–10.5)
nRBC: 0 % (ref 0.0–0.2)

## 2020-02-13 LAB — BASIC METABOLIC PANEL
Anion gap: 5 (ref 5–15)
BUN: 15 mg/dL (ref 8–23)
CO2: 31 mmol/L (ref 22–32)
Calcium: 8.1 mg/dL — ABNORMAL LOW (ref 8.9–10.3)
Chloride: 92 mmol/L — ABNORMAL LOW (ref 98–111)
Creatinine, Ser: 0.66 mg/dL (ref 0.61–1.24)
GFR, Estimated: >60 mL/min (ref >60)
Glucose, Bld: 90 mg/dL (ref 70–99)
Potassium: 4.3 mmol/L (ref 3.5–5.1)
Sodium: 128 mmol/L — ABNORMAL LOW (ref 135–145)

## 2020-02-13 LAB — LEGIONELLA PNEUMOPHILA SEROGP 1 UR AG: L. pneumophila Serogp 1 Ur Ag: NEGATIVE

## 2020-02-13 LAB — PHOSPHORUS: Phosphorus: 2.4 mg/dL — ABNORMAL LOW (ref 2.5–4.6)

## 2020-02-13 LAB — MAGNESIUM: Magnesium: 2 mg/dL (ref 1.7–2.4)

## 2020-02-13 MED ORDER — SODIUM CHLORIDE 1 G PO TABS
2.0000 g | ORAL_TABLET | Freq: Two times a day (BID) | ORAL | Status: AC
  Administered 2020-02-13 – 2020-02-14 (×2): 2 g via ORAL
  Filled 2020-02-13 (×2): qty 2

## 2020-02-13 MED ORDER — SODIUM CHLORIDE 0.9 % IV SOLN: INTRAVENOUS | Status: AC

## 2020-02-13 NOTE — Plan of Care (Signed)

## 2020-02-13 NOTE — Evaluation (Signed)
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  Patient has been following up with pulmonary clinic at [facility] clinic where extensive work-up has been conducted.  Patient has been on chronic steroids with good response,  while being on the steroids symptoms remains under good control.  His symptoms get worse as his steroids get tapering off.  He is also been on Bactrim prophylaxis.  Patient is admitted for acute on chronic hypoxic respiratory failure.  Pulmonology was consulted,  recommended IV steroids,  Zithromax. Anxiety medications, IV morphine.    Clinical Impression  Pt received in Semi-Fowler's position and agreeable to therapy.  Pt reports he was unable to use the restroom last night in his bathroom due to weakness.  Pt notes it was extremely difficult to manage ambulating that distance.  Pt also endorses a sharp pain in the ribs with deep breathing.  Upon arrival to room, pt was on 1.5 L of O2 and was sat >90%.  Upon performing bed-level exercises, pt desaturated to 89%.  MD and nurse came in to check on pt and to give medication.  Pt then proceeded to perform bed mobility with extra time given.  Pt desaturated to 84% and had difficulty coming back to >90%.  O2 concentration was steadily increased to 4L before a return to >90%.  Pt then stated he wanted to perform transfer over to the recliner.  Pt was able to maneuver with CGA requiring verbal cuing for hand placement due to pt never utilizing a walker prior to hospitalization.  Pt then transferred to chair with desaturation to 74% and steadily recovered after raising O2 concentration to 6L of O2.  Pt was able to rebound to 90% quickly, and  stayed above 90%.  All needs were met with call bell within reach of pt and wife in room following conclusion of therapy.  Pt was left in chair and nursing notified of O2 content increase and mobility with walker only requiring CGA for safety in returning to bed.  Pt stated he would like to stay upright in the chair for a while.  Pt will benefit from skilled PT intervention to increase independence and safety with basic mobility in preparation for discharge to the venue listed below.  Pt and wife were both agreeable to current d/c plans.      Follow Up Recommendations Home health PT    Equipment Recommendations  Rolling walker with 5" wheels    Recommendations for Other Services       Precautions / Restrictions Precautions Precautions: Fall Restrictions Weight Bearing Restrictions: No      Mobility  Bed Mobility Overal bed mobility: Modified Independent             General bed mobility comments: Pt able to navigate bed mobility with increased time to perform task.    Transfers Overall transfer level: Modified independent Equipment used: Rolling walker (2 wheeled)             General transfer comment: Pt able to perform STS with use of FWW and CGA.  Ambulation/Gait Ambulation/Gait assistance: Min guard Gait Distance (Feet): 3 Feet Assistive device: Rolling walker (2 wheeled) Gait Pattern/deviations: Step-to pattern;Decreased stride length Gait velocity: decreased   General Gait Details: Pt  able to maneuver FWW and ambulate to recliner in room.  Stairs            Wheelchair Mobility    Modified Rankin (Stroke Patients Only)       Balance Overall balance assessment: Mild deficits observed, not formally tested                                           Pertinent Vitals/Pain Pain Assessment: No/denies pain    Home Living Family/patient expects to be discharged to:: Private residence Living Arrangements: Spouse/significant  other Available Help at Discharge: Family;Available 24 hours/day Type of Home: House Home Access: Stairs to enter Entrance Stairs-Rails: Psychiatric nurse of Steps: 3 Home Layout: One level        Prior Function Level of Independence: Independent         Comments: independent in all mobility with no AD prev     Hand Dominance   Dominant Hand: Right    Extremity/Trunk Assessment   Upper Extremity Assessment Upper Extremity Assessment: Generalized weakness    Lower Extremity Assessment Lower Extremity Assessment: Generalized weakness       Communication   Communication: No difficulties  Cognition Arousal/Alertness: Awake/alert Behavior During Therapy: WFL for tasks assessed/performed Overall Cognitive Status: Within Functional Limits for tasks assessed                                        General Comments General comments (skin integrity, edema, etc.): Pt reports he has not used a FWW walker prior to coming to the hospital, however utilizes well within the hospital room and has good balance maneuvering the room.    Exercises Total Joint Exercises Ankle Circles/Pumps: AROM;Strengthening;Both;10 reps;Supine Quad Sets: AROM;Strengthening;Both;10 reps;Supine Gluteal Sets: AROM;Strengthening;Both;10 reps;Supine Heel Slides: AROM;Strengthening;Both;10 reps;Supine Hip ABduction/ADduction: AROM;Strengthening;Both;10 reps;Supine Straight Leg Raises: AROM;Strengthening;Both;10 reps;Supine Long Arc Quad: AROM;Strengthening;Both;10 reps;Supine Marching in Standing: AROM;Strengthening;Both;10 reps;Standing Other Exercises Other Exercises: Pt educated on role of PT and services provided during hosptial stay. Other Exercises: Pt educated on pursed lip and energy management to prevent desat of O2.   Assessment/Plan    PT Assessment Patient needs continued PT services  PT Problem List Decreased strength;Decreased activity  tolerance;Decreased balance;Decreased mobility;Decreased knowledge of use of DME       PT Treatment Interventions DME instruction;Gait training;Stair training;Functional mobility training;Therapeutic activities;Therapeutic exercise;Balance training;Neuromuscular re-education    PT Goals (Current goals can be found in the Care Plan section)  Acute Rehab PT Goals Patient Stated Goal: to get healthy and go home PT Goal Formulation: With patient Time For Goal Achievement: 02/27/20 Potential to Achieve Goals: Good    Frequency Min 2X/week   Barriers to discharge   O2 saturation with mobility.    Co-evaluation               AM-PAC PT "6 Clicks" Mobility  Outcome Measure Help needed turning from your back to your side while in a flat bed without using bedrails?: A Little Help needed moving from lying on your back to sitting on the side of a flat bed without using bedrails?: A Little Help needed moving to and from a bed to a chair (including a wheelchair)?: A Little Help needed standing up from a chair using your arms (e.g., wheelchair or bedside chair)?: A Little  Help needed to walk in hospital room?: A Little Help needed climbing 3-5 steps with a railing? : A Lot 6 Click Score: 17    End of Session Equipment Utilized During Treatment: Oxygen Activity Tolerance: Patient limited by fatigue Patient left: in chair;with call bell/phone within reach;with chair alarm set;with family/visitor present Nurse Communication: Mobility status PT Visit Diagnosis: Unsteadiness on feet (R26.81);Other abnormalities of gait and mobility (R26.89);Repeated falls (R29.6);Muscle weakness (generalized) (M62.81);History of falling (Z91.81);Difficulty in walking, not elsewhere classified (R26.2)    Time: 8867-7373 PT Time Calculation (min) (ACUTE ONLY): 59 min   Charges:   PT Evaluation $PT Eval Low Complexity: 1 Low PT Treatments $Gait Training: 8-22 mins $Therapeutic Exercise: 38-52 mins         Gwenlyn Saran, PT, DPT 02/13/20, 3:42 PM

## 2020-02-13 NOTE — Progress Notes (Signed)
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  Patient has been following up with pulmonary clinic at [facility] clinic where extensive work-up has been conducted.  Patient has been on chronic steroids with good response,  while being on the steroids symptoms remains under good control.  His symptoms get worse as his steroids get tapering off.  He is also been on Bactrim prophylaxis. Patient is admitted for acute on chronic hypoxic respiratory failure.  Pulmonology was consulted,  recommended IV steroids,  Zithromax. Anxiety medications, IV morphine.  Assessment & Plan:   Active Problems:   ILD (interstitial lung disease) ([place])   Hypertension   Hyponatremia   COPD with acute exacerbation (HCC)   Acute hypoxemic respiratory failure (HCC)   Acute on chronic hypoxemic respiratory failure due to interstitial lung disease and COPD:  Patient presenting with 1 week hx. of acute on chronic worsening dyspnea as well as increased oxygen requirement.   Work-up so far unrevealing, Chest x-ray unremarkable, suspect symptoms are due to worsening of his underlying ILD.  Dr. Lanney Gins from pulmonary has been consulted . Continue methylprednisolone and azithromycin per Pulm Continue Trelegy Ellipta, nebs as needed. Continue Bactrim  Recommended morphine for for dyspnea, anxiety medication, antitussive medication. Covid test negative.  Legionella antigen is negative. Patient seems improved, states cough is better, breathing is better, been using incentive spirometer.  # Hyponatremia: >> Improving . Suspect this could be secondary to hypovolemia,  Patient reports poor p.o. intake for last 1 week. Continue salt tablets 1 g  3 times daily.   Start normal saline for 24 hours. Follow-up urine sodium, urine osmolality, serum osmolality Trend sodium every 6 hours for time being.  # Leukocytosis: This could be secondary to underlying infection,  also could be due to chronic steroid use. Trend WBC curve.  # BPH  with LUTS: Consider flomax.  Poor appetite- likely multifactorial in setting of interstitial lung disease and chronic illness, consult nutrition.  Anxiety- Continue Lexapro, continue Xanax 0.5 mg 3 times daily.  Hypertension-Continue amlodipine, metoprolol   DVT prophylaxis: Lovenox  Code Status: Full code Family Communication: Wife at bedside  Disposition Plan:  Status is: Inpatient  Remains inpatient appropriate because:Inpatient level of care appropriate due to severity of illness   Dispo: The patient is from: Home              Anticipated d/c is to: Home              Anticipated d/c date is: > 3 days              Patient currently is medically stable to d/c.   Consultants:   Pulmonology  Procedures: None Antimicrobials: Anti-infectives (From admission, onward)   Start     Dose/Rate Route Frequency Ordered Stop   02/11/20 1000  azithromycin (ZITHROMAX) tablet 500 mg        500 mg Oral Daily 02/10/20 1542     02/10/20 2200  sulfamethoxazole-trimethoprim (BACTRIM) 400-80 MG per tablet 1 tablet        1 tablet Oral 2 times daily 02/10/20 1845     02/10/20 1300  azithromycin (ZITHROMAX) tablet 500 mg        500 mg Oral  Once 02/10/20  1259 02/10/20 1320     Subjective: Patient was seen and examined at bedside.  He reports worsening cough last night, has developed pain in the chest,  denies any difficulty breathing.  Objective: Vitals:   02/13/20 0034 02/13/20 0438 02/13/20 0844 02/13/20 1140  BP: 111/66 99/61 102/66 108/63  Pulse: 78 89 93 86  Resp: 16 18 18 20   Temp: 97.8 F (36.6 C) 98.2 F (36.8 C) 97.9 F (36.6 C) (!) 97.5 F (36.4 C)  TempSrc: Oral Oral  Oral   SpO2: 98% 95% 94% 95%  Weight:      Height:        Intake/Output Summary (Last 24 hours) at 02/13/2020 1313 Last data filed at 02/13/2020 1100 Gross per 24 hour  Intake 960 ml  Output 1150 ml  Net -190 ml   Filed Weights   02/10/20 2000  Weight: 68.7 kg    Examination:  General exam: Appears calm and comfortable , not appears in any distress. Respiratory system: Clear to auscultation. Respiratory effort normal. Cardiovascular system: S1 & S2 heard, RRR. No JVD, murmurs, rubs, gallops or clicks. No pedal edema. Gastrointestinal system: Abdomen is nondistended, soft and nontender. No organomegaly or masses felt. Normal bowel sounds heard. Central nervous system: Alert and oriented. No focal neurological deficits. Extremities: No edema, no cyanosis, no clubbing. Skin: No rashes, lesions or ulcers Psychiatry: Judgement and insight appear normal. Mood & affect appropriate.     Data Reviewed: I have personally reviewed following labs and imaging studies  CBC: Recent Labs  Lab 02/10/20 1055 02/11/20 0549 02/12/20 0327 02/13/20 0451  WBC 19.6* 13.5* 18.6* 16.8*  HGB 12.9* 11.4* 11.2* 10.8*  HCT 37.6* 32.6* 33.8* 32.6*  MCV 86.0 87.4 89.2 90.1  PLT 435* 367 407* [id number]   Basic Metabolic Panel: Recent Labs  Lab 02/10/20 1901 02/10/20 2213 02/11/20 0549 02/12/20 0327 02/13/20 0451  NA 121* 126* 129* 131* 128*  K 5.1 4.8 4.7 4.6 4.3  CL 89* 93* 97* 94* 92*  CO2 24 22 24 30 31   GLUCOSE 148* 159* 121* 126* 90  BUN 11 11 8 14 15   CREATININE 0.60* 0.53* 0.51* 0.66 0.66  CALCIUM 8.3* 8.3* 8.5* 8.6* 8.1*  MG  --   --   --  2.1 2.0  PHOS  --   --   --  2.8 2.4*   GFR: Estimated Creatinine Clearance: 93 mL/min (by C-G formula based on SCr of 0.66 mg/dL). Liver Function Tests: Recent Labs  Lab 02/11/20 0549  AST 18  ALT 19  ALKPHOS 47  BILITOT 0.8  PROT 5.8*  ALBUMIN 2.5*   No results for input(s): LIPASE, AMYLASE in the last 168 hours. No results for input(s):  AMMONIA in the last 168 hours. Coagulation Profile: No results for input(s): INR, PROTIME in the last 168 hours. Cardiac Enzymes: No results for input(s): CKTOTAL, CKMB, CKMBINDEX, TROPONINI in the last 168 hours. BNP (last 3 results) No results for input(s): PROBNP in the last 8760 hours. HbA1C: No results for input(s): HGBA1C in the last 72 hours. CBG: No results for input(s): GLUCAP in the last 168 hours. Lipid Profile: No results for input(s): CHOL, HDL, LDLCALC, TRIG, CHOLHDL, LDLDIRECT in the last 72 hours. Thyroid Function Tests: No results for input(s): TSH, T4TOTAL, FREET4, T3FREE, THYROIDAB in the last 72 hours. Anemia Panel: No results for input(s): VITAMINB12, FOLATE, FERRITIN, TIBC, IRON, RETICCTPCT in the last 72 hours. Sepsis Labs: Recent Labs  Lab 02/10/20 1901  PROCALCITON 0.12  Recent Results (from the past 240 hour(s))  Culture, blood (Routine X 2) w Reflex to ID Panel     Status: None (Preliminary result)   Collection Time: 02/10/20  1:13 PM   Specimen: BLOOD  Result Value Ref Range Status   Specimen Description BLOOD RIGHT ANTECUBITAL  Final   Special Requests   Final    BOTTLES DRAWN AEROBIC AND ANAEROBIC Blood Culture results may not be optimal due to an inadequate volume of blood received in culture bottles   Culture   Final    NO GROWTH 3 DAYS Performed at [facility], [address] [place], [place] [address]    Report Status PENDING  Incomplete  Culture, blood (Routine X 2) w Reflex to ID Panel     Status: None (Preliminary result)   Collection Time: 02/10/20  1:13 PM   Specimen: BLOOD  Result Value Ref Range Status   Specimen Description BLOOD LEFT ANTECUBITAL  Final   Special Requests   Final    BOTTLES DRAWN AEROBIC AND ANAEROBIC Blood Culture adequate volume   Culture   Final    NO GROWTH 3 DAYS Performed at [facility], [address], [place],  [address]    Report Status PENDING  Incomplete  Resp Panel  by RT-PCR (Flu A&B, Covid) Nasopharyngeal Swab     Status: None   Collection Time: 02/10/20  3:19 PM   Specimen: Nasopharyngeal Swab; Nasopharyngeal(NP) swabs in vial transport medium  Result Value Ref Range Status   SARS Coronavirus 2 by RT PCR NEGATIVE NEGATIVE Final    Comment: (NOTE) SARS-CoV-2 target nucleic acids are NOT DETECTED.  The SARS-CoV-2 RNA is generally detectable in upper respiratory specimens during the acute phase of infection. The lowest concentration of SARS-CoV-2 viral copies this assay can detect is 138 copies/mL. A negative result does not preclude SARS-Cov-2 infection and should not be used as the sole basis for treatment or other patient management decisions. A negative result may occur with  improper specimen collection/handling, submission of specimen other than nasopharyngeal swab, presence of viral mutation(s) within the areas targeted by this assay, and inadequate number of viral copies(<138 copies/mL). A negative result must be combined with clinical observations, patient history, and epidemiological information. The expected result is Negative.  Fact Sheet for Patients:  EntrepreneurPulse.com.au  Fact Sheet for Healthcare Providers:  IncredibleEmployment.be  This test is no t yet approved or cleared by the [place] FDA and  has been authorized for detection and/or diagnosis of SARS-CoV-2 by FDA under an Emergency Use Authorization (EUA). This EUA will remain  in effect (meaning this test can be used) for the duration of the COVID-19 declaration under Section 564(b)(1) of the Act, 21 U.S.C.section 360bbb-3(b)(1), unless the authorization is terminated  or revoked sooner.       Influenza A by PCR NEGATIVE NEGATIVE Final   Influenza B by PCR NEGATIVE NEGATIVE Final    Comment: (NOTE) The Xpert Xpress SARS-CoV-2/FLU/RSV plus assay is intended as an aid in the diagnosis of influenza from Nasopharyngeal swab  specimens and should not be used as a sole basis for treatment. Nasal washings and aspirates are unacceptable for Xpert Xpress SARS-CoV-2/FLU/RSV testing.  Fact Sheet for Patients: EntrepreneurPulse.com.au  Fact Sheet for Healthcare Providers: IncredibleEmployment.be  This test is not yet approved or cleared by the [place] FDA and has been authorized for detection and/or diagnosis of SARS-CoV-2 by FDA under an Emergency Use Authorization (EUA). This EUA will remain in effect (meaning  this test can be used) for the duration of the COVID-19 declaration under Section 564(b)(1) of the Act, 21 U.S.C. section 360bbb-3(b)(1), unless the authorization is terminated or revoked.  Performed at [facility], [address], [place], [place] [address]   Urine Culture     Status: Abnormal   Collection Time: 02/10/20  4:08 PM   Specimen: Urine, Random  Result Value Ref Range Status   Specimen Description   Final    URINE, RANDOM Performed at [facility], [address], [place], [place] [address]    Special Requests   Final    NONE Performed at [facility], [address] [place], [place] [address]    Culture (A)  Final    <10,000 COLONIES/mL INSIGNIFICANT GROWTH Performed at [facility], [address] [place], [place] [address]    Report Status 02/11/2020 FINAL  Final  Expectorated sputum assessment w rflx to resp cult     Status: None   Collection Time: 02/12/20  2:15 PM   Specimen: Expectorated Sputum  Result Value Ref Range Status   Specimen Description EXPECTORATED SPUTUM  Final   Special Requests NONE  Final   Sputum evaluation   Final    Sputum specimen not acceptable for testing.  Please recollect.   C/BONNYE ROSE AT 1600 02/12/20.PMF Performed at [facility], [address] [place], [place] [address]    Report Status 02/12/2020 FINAL  Final  Expectorated sputum assessment w rflx  to resp cult     Status: None   Collection Time: 02/12/20  6:02 PM  Result Value Ref Range Status   Specimen Description EXPECTORATED SPUTUM  Final   Special Requests NONE  Final   Sputum evaluation   Final    THIS SPECIMEN IS ACCEPTABLE FOR SPUTUM CULTURE Performed at [facility], [address], [place], [place] [address]    Report Status 02/12/2020 FINAL  Final  Culture, respiratory     Status: None (Preliminary result)   Collection Time: 02/12/20  6:02 PM  Result Value Ref Range Status   Specimen Description   Final    EXPECTORATED SPUTUM Performed at [facility], [address], [place], [place] [address]    Special Requests   Final    NONE Reflexed from [id number] Performed at [facility], [address] [place], [place] [address]    Gram Stain   Final    FEW WBC PRESENT, PREDOMINANTLY PMN RARE GRAM POSITIVE COCCI IN CLUSTERS Performed at [facility], [address], [place], [place] [address]    Culture PENDING  Incomplete   Report Status PENDING  Incomplete    Radiology Studies: No results found.   Scheduled Meds: . ALPRAZolam  0.5 mg Oral TID  . amLODipine  5 mg Oral Daily  . azithromycin  500 mg Oral Daily  . enoxaparin (LOVENOX) injection  40 mg Subcutaneous Q24H  . escitalopram  20 mg Oral Daily  . feeding supplement  237 mL Oral BID BM  . fluticasone furoate-vilanterol  1 puff Inhalation Daily   And  . umeclidinium bromide  1 puff Inhalation Daily  . methylPREDNISolone (SOLU-MEDROL) injection  40 mg Intravenous Q24H  . metoprolol tartrate  50 mg Oral BID  . sodium chloride flush  3 mL Intravenous Q12H  . sulfamethoxazole-trimethoprim  1 tablet Oral BID   Continuous Infusions:    LOS: 3 days    Time spent: 25 mins  Shawna Clamp, MD [facility]   If 7PM-7AM, please contact night-coverage

## 2020-02-13 NOTE — Evaluation (Addendum)
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  Patient has been following up with pulmonary clinic at [facility] clinic where extensive work-up has been conducted.  Patient has been on chronic steroids with good response,  while being on the steroids symptoms remains under good control.  His symptoms get worse as his steroids get tapering off.  He is also been on Bactrim prophylaxis.  Patient is admitted for acute on chronic hypoxic respiratory failure.  Pulmonology was consulted,  recommended IV steroids,  Zithromax. Anxiety medications, IV morphine.   Clinical Impression   Pt seen for OT evaluation this date in setting of acute hospitalization with worsening SOB (report pattern of waxing/waning since Sept hospitalization). Pt agreeable to evaluation, but states he is fatigued from sitting up after PT today and having just gotten back to bed, so politely declined to get OOB again. Pt and spouse report that pt is INDEP with self care and fxl mobility at baseline, does have shower seat and requires some assist for drying off when having increased SOB periods. Pt presents this date with some decreased fxl activity tolerance and general weakness. Pt requires MIN A for LB ADLs purely d/t decreased tolerance and SETUP for seated UB ADLs purely d/t decreased tolerance. Pt with limited standing/fxl mobility tolerance, uses RW with CGA per PT note. Will continue to follow in acute setting for prevention of further functional decline, however, do not anticipate pt will require f/u therapy services in home setting upon d/c.     Follow Up Recommendations  No OT follow up    Equipment  Recommendations  3 in 1 bedside commode    Recommendations for Other Services       Precautions / Restrictions Precautions Precautions: Fall Restrictions Weight Bearing Restrictions: No      Mobility Bed Mobility             General bed mobility comments: declined    Transfers             General transfer comment: declined, pt citing fatigue    Balance Overall balance assessment: Mild deficits observed, not formally tested                                         ADL either performed or assessed with clinical judgement   ADL                                         General ADL Comments: requires MIN A for LB ADLs purely d/t decreased tolerance and SETUP for seated UB ADLs purely d/t decreased tolerance. Pt with limited standing/fxl mobility tolerance, uses RW with CGA per PT note, declines to get OOB for second time today on OT evlauation.     Vision Baseline Vision/History: Wears glasses Wears Glasses: At all times Patient Visual Report: No change from baseline       Perception     Praxis      Pertinent Vitals/Pain Pain Assessment: 0-10 Pain Score: 4  Pain Location: back  where he has pulled a muscle 2/2 coughing Pain Descriptors / Indicators: Tender;Tightness Pain Intervention(s): Limited activity within patient's tolerance;Monitored during session     Hand Dominance Right   Extremity/Trunk Assessment Upper Extremity Assessment Upper Extremity Assessment: Generalized weakness   Lower Extremity Assessment Lower Extremity Assessment: Generalized weakness       Communication Communication Communication: No difficulties   Cognition Arousal/Alertness: Awake/alert Behavior During Therapy: WFL for tasks assessed/performed Overall Cognitive Status: Within Functional Limits for tasks assessed                                     General Comments  Pt reports he has not used a FWW walker prior to  coming to the hospital, however utilizes well within the hospital room and has good balance maneuvering the room.    Exercises Other Exercises: OT faciltiates ed re: role of OT in acute setting including energy conservation strategies and HEP for core to improve quality of breathing. Pt and spouse with good understanding of all concepts. Other Exercises: Pt educated on pursed lip and energy management to prevent desat of O2.   Shoulder Instructions      Home Living Family/patient expects to be discharged to:: Private residence Living Arrangements: Spouse/significant other Available Help at Discharge: Family;Available 24 hours/day Type of Home: House Home Access: Stairs to enter [employer] of Steps: 3 Entrance Stairs-Rails: Right;Left Home Layout: Two level;Able to live on main level with bedroom/bathroom     Bathroom Shower/Tub: Occupational psychologist: Standard Bathroom Accessibility: Yes How Accessible: Accessible via walker Home Equipment: None;Shower seat;Grab bars - tub/shower          Prior Functioning/Environment Level of Independence: Independent        Comments: independent in all mobility with no AD prev, but with waxing and waning tolerance since Hospitalization in September of 2021        OT Problem List: Decreased strength;Decreased activity tolerance;Cardiopulmonary status limiting activity      OT Treatment/Interventions: Self-care/ADL training;DME and/or AE instruction;Therapeutic activities;Patient/family education    OT Goals(Current goals can be found in the care plan section) Acute Rehab OT Goals Patient Stated Goal: to get healthy and go home OT Goal Formulation: With patient/family Time For Goal Achievement: 02/27/20 Potential to Achieve Goals: Good ADL Goals Pt Will Transfer to Toilet: with supervision;with min guard assist;ambulating;grab bars (to standard commode in restroom with LRAD with only 1 standing rest  break) Pt/caregiver will Perform Home Exercise Program: Increased strength;Both right and left upper extremity;With Supervision Additional ADL Goal #1: Pt with verbalize 2 new EC strategies (versus strategies they've already been implementing at home) with 0 verbal cues.  OT Frequency: Min 1X/week   Barriers to D/C:            Co-evaluation              AM-PAC OT "6 Clicks" Daily Activity     Outcome Measure Help from another person eating meals?: None Help from another person taking care of personal grooming?: A Little Help from another person toileting, which includes using toliet, bedpan, or urinal?: A Little Help from another person bathing (including washing, rinsing, drying)?: A Little Help from another person to put on and taking off regular upper body clothing?: None Help from another person to put on and taking off regular lower body clothing?: A Little 6 Click Score: 20   End of  Session Equipment Utilized During Treatment: Oxygen  Activity Tolerance: Patient limited by fatigue Patient left: in bed;with call bell/phone within reach;with family/visitor present  OT Visit Diagnosis: Muscle weakness (generalized) (M62.81)                Time: [id number] OT Time Calculation (min): 38 min Charges:  OT General Charges $OT Visit: 1 Visit OT Evaluation $OT Eval Moderate Complexity: 1 Mod OT Treatments $Self Care/Home Management : 23-37 mins  Gerrianne Scale, MS, OTR/L ascom [phone] 02/13/20, 4:56 PM

## 2020-02-13 NOTE — Progress Notes (Signed)
Pulmonary Medicine          Date: 02/13/2020,   MRN# [id number] Austin Kim 1956-01-06     AdmissionWeight: 68.7 kg                 CurrentWeight: 68.7 kg   Referring physician Dr. Jacqualine Code   CHIEF COMPLAINT:   Acute exacerbation of interstitial lung disease.   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  Seen in outpatient pulmonary clinic with mild improvement had additional evaluation at [facility] with ILD clinic thought to have possible ILD flare and perhaps initiation of antifibrotic therapy at some point.  States he had 1 day of diarrhea 1 day prior to admission no fevers, denies hemoptysis, still has chronic cough which is dry with costochondritis.  He is also noted to have hyponatremia at 118 which is worsened from previous which was 128 in September during his admission.  He did admit to taking multiple doses of NSAIDs including naproxen twice daily.  And recently started Lexapro due to ongoing general anxiety disorder refractory to Valium at home.  He had chest x-ray done here which is improved compared to previous in September mostly with interstitial basal predominant opacities consistent with known pulmonary fibrosis.  There is no pneumothorax, no consolidated infiltrate, no signs of pleural effusion, pulmonary edema.  Blood work is not consistent with infection, pulmonary consultation placed for additional management of ILD flare.   -02/12/20- patient is improved. We will decrease steroids to once daily 40mg .  Have dcd all  narcotics today.  He was unable to sleep last night.  02/13/20- patient was having difficulty today, he feels more dyspneic (although O2 requirement is at baseline 2L/min), he was having difficulty standing up and could not walk to commode. Discussed with Dr Dwyane Dee today.   PAST MEDICAL HISTORY   Past Medical History:  Diagnosis Date  . Blindness of right eye   . COPD (chronic obstructive pulmonary disease) ([place])   . Elevated lipids   . Hx of basal cell carcinoma 02/05/2010   L temple  . Hx of dysplastic nevus 04/12/2019   R spinal mid back, moderate to severe atypia, excised 05/09/2019  . Hypertension   . Spontaneous pneumothorax   . Squamous cell carcinoma of skin 04/12/2019   Posterior Crown, SCC IS     SURGICAL HISTORY   Past Surgical History:  Procedure Laterality Date  . BASAL CELL CARCINOMA EXCISION Bilateral   . COLONOSCOPY WITH PROPOFOL N/A 07/28/2017   Procedure: COLONOSCOPY WITH PROPOFOL;  Surgeon: Lollie Sails, MD;  Location: [facility] ENDOSCOPY;  Service: Endoscopy;  Laterality: N/A;  . EYE SURGERY    . PLEURAL SCARIFICATION       FAMILY HISTORY   Family History  Problem Relation Age of Onset  . Diabetes Father   . CAD Father   . Colon cancer Father   . Heart disease Father        Heart transplant  . Diabetes Brother        Type 1 DM     SOCIAL HISTORY   Social  History   Tobacco Use  . Smoking status: Former Smoker    Quit date: 2001    Years since quitting: 21.0  . Smokeless tobacco: Never Used  . Tobacco comment: 2 PPD for ~20 years  Vaping Use  . Vaping Use: Never used  Substance Use Topics  . Alcohol use: Yes  . Drug use: No     MEDICATIONS    Home Medication:    Current Medication:  Current Facility-Administered Medications:  .  ALPRAZolam Duanne Moron) tablet 0.5 mg, 0.5 mg, Oral, TID, Clarise Chacko, MD, 0.5 mg at 02/13/20 1018 .  amLODipine (NORVASC) tablet 5 mg, 5 mg, Oral, Daily, Clarnce Flock, MD, 5 mg at 02/12/20  1052 .  azithromycin (ZITHROMAX) tablet 500 mg, 500 mg, Oral, Daily, Clarnce Flock, MD, 500 mg at 02/13/20 1018 .  enoxaparin (LOVENOX) injection 40 mg, 40 mg, Subcutaneous, Q24H, Clarnce Flock, MD, 40 mg at 02/12/20 2247 .  escitalopram (LEXAPRO) tablet 20 mg, 20 mg, Oral, Daily, Clarnce Flock, MD, 20 mg at 02/13/20 1018 .  feeding supplement (ENSURE ENLIVE / ENSURE PLUS) liquid 237 mL, 237 mL, Oral, BID BM, Shawna Clamp, MD, 237 mL at 02/13/20 1019 .  fluticasone furoate-vilanterol (BREO ELLIPTA) 100-25 MCG/INH 1 puff, 1 puff, Inhalation, Daily, 1 puff at 02/13/20 1017 **AND** umeclidinium bromide (INCRUSE ELLIPTA) 62.5 MCG/INH 1 puff, 1 puff, Inhalation, Daily, Clarnce Flock, MD, 1 puff at 02/13/20 1017 .  guaiFENesin-dextromethorphan (ROBITUSSIN DM) 100-10 MG/5ML syrup 5 mL, 5 mL, Oral, Q4H PRN, Lang Snow, NP, 5 mL at 02/12/20 2247 .  methylPREDNISolone sodium succinate (SOLU-MEDROL) 40 mg/mL injection 40 mg, 40 mg, Intravenous, Q24H, Lanney Gins, Divit Stipp, MD, 40 mg at 02/13/20 0651 .  metoprolol tartrate (LOPRESSOR) tablet 50 mg, 50 mg, Oral, BID, Clarnce Flock, MD, 50 mg at 02/13/20 1018 .  morphine 2 MG/ML injection 1 mg, 1 mg, Intravenous, Q3H PRN, Ottie Glazier, MD, 1 mg at 02/13/20 1019 .  ondansetron (ZOFRAN) tablet 4 mg, 4 mg, Oral, Q6H PRN **OR** ondansetron (ZOFRAN) injection 4 mg, 4 mg, Intravenous, Q6H PRN, Clarnce Flock, MD .  sodium chloride flush (NS) 0.9 % injection 3 mL, 3 mL, Intravenous, Q12H, Clarnce Flock, MD, 3 mL at 02/13/20 1019 .  sulfamethoxazole-trimethoprim (BACTRIM) 400-80 MG per tablet 1 tablet, 1 tablet, Oral, BID, Clarnce Flock, MD, 1 tablet at 02/13/20 1018 .  traZODone (DESYREL) tablet 50 mg, 50 mg, Oral, QHS PRN, Clarnce Flock, MD, 50 mg at 02/12/20 2247    ALLERGIES   Patient has no known allergies.     REVIEW OF SYSTEMS    Review of Systems:  Gen:  Denies  fever, sweats, chills weigh loss   HEENT: Denies blurred vision, double vision, ear pain, eye pain, hearing loss, nose bleeds, sore throat Cardiac:  No dizziness, chest pain or heaviness, chest tightness,edema Resp: reports cough chronically   Gi: reports diarreah  Gu:  Reports new onset dribbling of urine Ext:   Denies Joint pain, stiffness or swelling Skin: Denies  skin rash, easy bruising or bleeding or hives Endoc:  Denies polyuria, polydipsia , polyphagia or weight change Psych:   Reports severe anxiety chronically Other:  All other systems negative   VS: BP 108/63 (BP Location: Left Arm)   Pulse 86   Temp (!) 97.5 F (36.4 C) (Oral)   Resp 20   Ht 5\' 9"  (1.753 m)   Wt 68.7 kg   SpO2 95%   BMI  22.37 kg/m      PHYSICAL EXAM    GENERAL:NAD, no fevers, chills, no weakness no fatigue HEAD: Normocephalic, atraumatic.  EYES: Pupils equal, round, reactive to light. Extraocular muscles intact. No scleral icterus.  MOUTH: Moist mucosal membrane. Dentition intact. No abscess noted.  EAR, NOSE, THROAT: Clear without exudates. No external lesions.  NECK: Supple. No thyromegaly. No nodules. No JVD.  PULMONARY: velcro crepitations at bases bilaterally  CARDIOVASCULAR: S1 and S2. Regular rate and rhythm. No murmurs, rubs, or gallops. No edema. Pedal pulses 2+ bilaterally.  GASTROINTESTINAL: Soft, nontender, nondistended. No masses. Positive bowel sounds. No hepatosplenomegaly.  MUSCULOSKELETAL: No swelling, clubbing, or edema. Range of motion full in all extremities.  NEUROLOGIC: Cranial nerves II through XII are intact. No gross focal neurological deficits. Sensation intact. Reflexes intact.  SKIN: No ulceration, lesions, rashes, or cyanosis. Skin warm and dry. Turgor intact.  PSYCHIATRIC: Mood, affect within normal limits. The patient is awake, alert and oriented x 3. Insight, judgment intact.       IMAGING    DG Chest 2 View  Result Date: 02/10/2020 CLINICAL DATA:  Shortness of breath. Chest pain with  deep inspiration. Home oxygen use. EXAM: CHEST - 2 VIEW COMPARISON:  11/12/2019.  CTA chest 11/06/2019. FINDINGS: Midline trachea. Mild cardiomegaly. No pleural effusion or pneumothorax. Biapical pleural thickening. Basilar predominant interstitial opacities are similar. Relative sparing of the lung apices. IMPRESSION: Similar basilar predominant interstitial opacities. When compared to 11/06/2019 CT, this is primarily felt to represent post infectious/inflammatory fibrosis. Superimposed acute/subacute pneumonia cannot be excluded. Electronically Signed   By: Abigail Miyamoto M.D.   On: 02/10/2020 11:41      ASSESSMENT/PLAN   Acute exacerbation of interstitial lung disease   - patient is with stable CXR with no interval changes from previous done this month on outpatient in pulmonary clinic.   - patient is at home O2 setting - 3L [place]  - he respnoded well to IV solumedrol during previous exacerbation and has received solumedrol on presentation to ED. - significant dyspnea and breathlessness -  IV morphine PRN -has been on bactrim for PCP ppx and unlikely has bacterial superinfection.  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 - patient has been taking several doses of NSAIDS daily, he does have increased H2O intake and low Na intake PO as well as initiaion of lexapro on outpatient all of which are likely contributing to hyponatremia - have added TID salt tabs with meals >>once daily - patient has poor po intake  -repeat Urine Na   Generalized anxiety disorder  - s/p Valium on outpatient with recent tapering off which may have caused recurrence of anxiety symptoms  - have added 0.5 TID alprazolam PO  - consider psychiatric evaluation    Combined pulmonary fibrosis and emphysema (CPFE)   Patient with bullous emphysema - he is high risk for  pneumothorax with this froceful dry cough, id prefer patient be on some anti-tussive to prevent worsening costochondritis, pneumothorax, and rarely rib and sternal fracture.   - he is on PRN morphine 1mg  and I will add scheduled Hycodan 37ml     Thank you for allowing me to participate in the care of this patient.   Patient/Family are satisfied with care plan and all questions have been answered.  This document was prepared using Dragon voice recognition software and may include unintentional  dictation errors.     Ottie Glazier, M.D.  Division of [facility]

## 2020-02-14 DIAGNOSIS — J9601 Acute respiratory failure with hypoxia: Secondary | ICD-10-CM | POA: Diagnosis not present

## 2020-02-14 DIAGNOSIS — J441 Chronic obstructive pulmonary disease with (acute) exacerbation: Secondary | ICD-10-CM | POA: Diagnosis not present

## 2020-02-14 DIAGNOSIS — J849 Interstitial pulmonary disease, unspecified: Secondary | ICD-10-CM | POA: Diagnosis not present

## 2020-02-14 LAB — CBC
HCT: 30.8 % — ABNORMAL LOW (ref 39.0–52.0)
Hemoglobin: 10.1 g/dL — ABNORMAL LOW (ref 13.0–17.0)
MCH: 29.4 pg (ref 26.0–34.0)
MCHC: 32.8 g/dL (ref 30.0–36.0)
MCV: 89.5 fL (ref 80.0–100.0)
Platelets: 331 10*3/uL (ref 150–400)
RBC: 3.44 MIL/uL — ABNORMAL LOW (ref 4.22–5.81)
RDW: 13.9 % (ref 11.5–15.5)
WBC: 15.8 10*3/uL — ABNORMAL HIGH (ref 4.0–10.5)
nRBC: 0 % (ref 0.0–0.2)

## 2020-02-14 LAB — BASIC METABOLIC PANEL
Anion gap: 6 (ref 5–15)
BUN: 13 mg/dL (ref 8–23)
CO2: 29 mmol/L (ref 22–32)
Calcium: 7.9 mg/dL — ABNORMAL LOW (ref 8.9–10.3)
Chloride: 95 mmol/L — ABNORMAL LOW (ref 98–111)
Creatinine, Ser: 0.48 mg/dL — ABNORMAL LOW (ref 0.61–1.24)
GFR, Estimated: >60 mL/min (ref >60)
Glucose, Bld: 96 mg/dL (ref 70–99)
Potassium: 3.8 mmol/L (ref 3.5–5.1)
Sodium: 130 mmol/L — ABNORMAL LOW (ref 135–145)

## 2020-02-14 LAB — PHOSPHORUS: Phosphorus: 2.5 mg/dL (ref 2.5–4.6)

## 2020-02-14 LAB — MAGNESIUM: Magnesium: 1.9 mg/dL (ref 1.7–2.4)

## 2020-02-14 NOTE — Progress Notes (Signed)
Per wife patient was seen by Kateri Mc Pulmonologist and his case was presented at a ILD conference on 02/13/2020. Group consensus at that time was that the patient has combined pulmonary fibrosis and emphysema. Wife states patient has not had a positive COVID test to date. Wife unsure why medical records are reflecting PMH of COVID complications and COVID diagnosis and would like RN to make mention of this for record.

## 2020-02-14 NOTE — Progress Notes (Signed)
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  Patient has been following up with pulmonary medicine at [facility] clinic where extensive work-up has been conducted.  Patient has been on chronic steroids with good response,  while being on the steroids symptoms remains under good control.  His symptoms get worse as his steroids get tapering off.  He is also been on Bactrim prophylaxis. Patient is admitted for acute on chronic hypoxic respiratory failure.  Pulmonology was consulted,  recommended IV steroids,  Zithromax. Anxiety medications, IV morphine.  Assessment & Plan:   Active Problems:   ILD (interstitial lung disease) ([place])   Hypertension   Hyponatremia   COPD with acute exacerbation (HCC)   Acute hypoxemic respiratory failure (HCC)   Acute on chronic hypoxemic respiratory failure due to interstitial lung disease and COPD:  Patient presented with 1 week hx. of acute on chronic worsening dyspnea as well as increased oxygen requirement.   He usually uses 2 L/ m of supplemental oxygen at baseline which has increased to 4 L. Work-up so far unrevealing, Chest x-ray unremarkable, Suspect symptoms are due to worsening of his underlying ILD.  Dr. Lanney Gins from pulmonary has been consulted . Continue methylprednisolone and azithromycin per Pulm Continue Trelegy Ellipta, nebs as needed. Continue Bactrim  Procalcitonin 0.12, blood cultures no growth so far. Recommended morphine  for dyspnea, anxiety medication, antitussive medication. Covid test negative.  Legionella antigen is negative.  Strep pneumo negative. Patient seems improved, states cough is better, breathing is better, been using incentive  spirometer.  # Hyponatremia: >> Improving . Suspect this could be secondary to hypovolemia,  Patient reports poor p.o. intake for last 1 week. Continue salt tablets 2 g 3 times daily.   Continue normal saline for 24 hours. Follow-up urine sodium, urine osmolality, serum osmolality Serum sodium improving. 130   # Leukocytosis: This could be secondary to underlying infection,  also could be due to chronic steroid use. Trend WBC curve.  # BPH  with LUTS: continue flomax.  Poor appetite- likely multifactorial in setting of interstitial lung disease and chronic illness, consult nutrition.  Anxiety- Continue Lexapro, continue Xanax 0.5 mg 3 times daily.  Hypertension-Continue amlodipine, metoprolol.   DVT prophylaxis: Lovenox  Code Status: Full code Family Communication: Wife at bedside  Disposition Plan:  Status is: Inpatient  Remains inpatient appropriate because:Inpatient level of care appropriate due to severity of illness   Dispo: The patient is from: Home              Anticipated d/c is to: Home              Anticipated d/c date is:12/29.              Patient currently is medically stable to d/c.   Consultants:   Pulmonology  Procedures: None Antimicrobials: Anti-infectives (From admission, onward)   Start     Dose/Rate Route Frequency Ordered Stop   02/11/20 1000  azithromycin (ZITHROMAX) tablet 500 mg        500 mg Oral Daily 02/10/20 1542     02/10/20 2200  sulfamethoxazole-trimethoprim (BACTRIM) 400-80 MG per tablet 1 tablet        1 tablet Oral 2 times daily 02/10/20 1845  02/10/20 1300  azithromycin (ZITHROMAX) tablet 500 mg        500 mg Oral  Once 02/10/20 1259 02/10/20 1320     Subjective: Patient was seen and examined at bedside.  He reports feeling drained and weak after having bowel movement.   His O2 saturation has dropped to 83% , his O2 requirement has increased to 4 L/m.  He reports not feeling well to go home  today. Objective: Vitals:   02/14/20 0028 02/14/20 0448 02/14/20 0723 02/14/20 1202  BP: 110/65 109/61 (!) 104/58 114/62  Pulse: 89 90 91 95  Resp: 16 16 20 20   Temp: 97.9 F (36.6 C) 98.2 F (36.8 C) 98.2 F (36.8 C) 97.8 F (36.6 C)  TempSrc: Oral Oral Oral Oral  SpO2: 95% 91% 93% 95%  Weight:      Height:        Intake/Output Summary (Last 24 hours) at 02/14/2020 1210 Last data filed at 02/14/2020 1025 Gross per 24 hour  Intake 1351.24 ml  Output 1100 ml  Net 251.24 ml   Filed Weights   02/10/20 2000  Weight: 68.7 kg    Examination:  General exam: Appears calm and comfortable , not appears in any distress. Respiratory system: Clear to auscultation. Respiratory effort normal. Cardiovascular system: S1 & S2 heard, RRR. No JVD, murmurs, rubs, gallops or clicks. No pedal edema. Gastrointestinal system: Abdomen is nondistended, soft and nontender. No organomegaly or masses felt. Normal bowel sounds heard. Central nervous system: Alert and oriented. No focal neurological deficits. Extremities: No edema, no cyanosis, no clubbing. Skin: No rashes, lesions or ulcers Psychiatry: Judgement and insight appear normal. Mood & affect appropriate.     Data Reviewed: I have personally reviewed following labs and imaging studies  CBC: Recent Labs  Lab 02/10/20 1055 02/11/20 0549 02/12/20 0327 02/13/20 0451 02/14/20 0553  WBC 19.6* 13.5* 18.6* 16.8* 15.8*  HGB 12.9* 11.4* 11.2* 10.8* 10.1*  HCT 37.6* 32.6* 33.8* 32.6* 30.8*  MCV 86.0 87.4 89.2 90.1 89.5  PLT 435* 367 407* 362 [id number]   Basic Metabolic Panel: Recent Labs  Lab 02/10/20 2213 02/11/20 0549 02/12/20 0327 02/13/20 0451 02/14/20 0553  NA 126* 129* 131* 128* 130*  K 4.8 4.7 4.6 4.3 3.8  CL 93* 97* 94* 92* 95*  CO2 22 24 30 31 29   GLUCOSE 159* 121* 126* 90 96  BUN 11 8 14 15 13   CREATININE 0.53* 0.51* 0.66 0.66 0.48*  CALCIUM 8.3* 8.5* 8.6* 8.1* 7.9*  MG  --   --  2.1 2.0 1.9  PHOS  --   --  2.8 2.4*  2.5   GFR: Estimated Creatinine Clearance: 93 mL/min (A) (by C-G formula based on SCr of 0.48 mg/dL (L)). Liver Function Tests: Recent Labs  Lab 02/11/20 0549  AST 18  ALT 19  ALKPHOS 47  BILITOT 0.8  PROT 5.8*  ALBUMIN 2.5*   No results for input(s): LIPASE, AMYLASE in the last 168 hours. No results for input(s): AMMONIA in the last 168 hours. Coagulation Profile: No results for input(s): INR, PROTIME in the last 168 hours. Cardiac Enzymes: No results for input(s): CKTOTAL, CKMB, CKMBINDEX, TROPONINI in the last 168 hours. BNP (last 3 results) No results for input(s): PROBNP in the last 8760 hours. HbA1C: No results for input(s): HGBA1C in the last 72 hours. CBG: No results for input(s): GLUCAP in the last 168 hours. Lipid Profile: No results for input(s): CHOL, HDL, LDLCALC, TRIG, CHOLHDL, LDLDIRECT in the last 72 hours.  Thyroid Function Tests: No results for input(s): TSH, T4TOTAL, FREET4, T3FREE, THYROIDAB in the last 72 hours. Anemia Panel: No results for input(s): VITAMINB12, FOLATE, FERRITIN, TIBC, IRON, RETICCTPCT in the last 72 hours. Sepsis Labs: Recent Labs  Lab 02/10/20 1901  PROCALCITON 0.12    Recent Results (from the past 240 hour(s))  Culture, blood (Routine X 2) w Reflex to ID Panel     Status: None (Preliminary result)   Collection Time: 02/10/20  1:13 PM   Specimen: BLOOD  Result Value Ref Range Status   Specimen Description BLOOD RIGHT ANTECUBITAL  Final   Special Requests   Final    BOTTLES DRAWN AEROBIC AND ANAEROBIC Blood Culture results may not be optimal due to an inadequate volume of blood received in culture bottles   Culture   Final    NO GROWTH 4 DAYS Performed at [facility], [address], [place], [place] [address]    Report Status PENDING  Incomplete  Culture, blood (Routine X 2) w Reflex to ID Panel     Status: None (Preliminary result)   Collection Time: 02/10/20  1:13 PM   Specimen: BLOOD  Result Value Ref Range  Status   Specimen Description BLOOD LEFT ANTECUBITAL  Final   Special Requests   Final    BOTTLES DRAWN AEROBIC AND ANAEROBIC Blood Culture adequate volume   Culture   Final    NO GROWTH 4 DAYS Performed at [facility], [address] [place], [place] [address]    Report Status PENDING  Incomplete  Resp Panel by RT-PCR (Flu A&B, Covid) Nasopharyngeal Swab     Status: None   Collection Time: 02/10/20  3:19 PM   Specimen: Nasopharyngeal Swab; Nasopharyngeal(NP) swabs in vial transport medium  Result Value Ref Range Status   SARS Coronavirus 2 by RT PCR NEGATIVE NEGATIVE Final    Comment: (NOTE) SARS-CoV-2 target nucleic acids are NOT DETECTED.  The SARS-CoV-2 RNA is generally detectable in upper respiratory specimens during the acute phase of infection. The lowest concentration of SARS-CoV-2 viral copies this assay can detect is 138 copies/mL. A negative result does not preclude SARS-Cov-2 infection and should not be used as the sole basis for treatment or other patient management decisions. A negative result may occur with  improper specimen collection/handling, submission of specimen other than nasopharyngeal swab, presence of viral mutation(s) within the areas targeted by this assay, and inadequate number of viral copies(<138 copies/mL). A negative result must be combined with clinical observations, patient history, and epidemiological information. The expected result is Negative.  Fact Sheet for Patients:  EntrepreneurPulse.com.au  Fact Sheet for Healthcare Providers:  IncredibleEmployment.be  This test is no t yet approved or cleared by the [place] FDA and  has been authorized for detection and/or diagnosis of SARS-CoV-2 by FDA under an Emergency Use Authorization (EUA). This EUA will remain  in effect (meaning this test can be used) for the duration of the COVID-19 declaration under Section 564(b)(1) of the Act,  21 U.S.C.section 360bbb-3(b)(1), unless the authorization is terminated  or revoked sooner.       Influenza A by PCR NEGATIVE NEGATIVE Final   Influenza B by PCR NEGATIVE NEGATIVE Final    Comment: (NOTE) The Xpert Xpress SARS-CoV-2/FLU/RSV plus assay is intended as an aid in the diagnosis of influenza from Nasopharyngeal swab specimens and should not be used as a sole basis for treatment. Nasal washings and aspirates are unacceptable for Xpert Xpress SARS-CoV-2/FLU/RSV testing.  Fact Sheet for  Patients: EntrepreneurPulse.com.au  Fact Sheet for Healthcare Providers: IncredibleEmployment.be  This test is not yet approved or cleared by the [place] FDA and has been authorized for detection and/or diagnosis of SARS-CoV-2 by FDA under an Emergency Use Authorization (EUA). This EUA will remain in effect (meaning this test can be used) for the duration of the COVID-19 declaration under Section 564(b)(1) of the Act, 21 U.S.C. section 360bbb-3(b)(1), unless the authorization is terminated or revoked.  Performed at [facility], [address] [place], [place] [address]   Urine Culture     Status: Abnormal   Collection Time: 02/10/20  4:08 PM   Specimen: Urine, Random  Result Value Ref Range Status   Specimen Description   Final    URINE, RANDOM Performed at [facility], [address], [place], [place] [address]    Special Requests   Final    NONE Performed at [facility], [address] [place], [place] [address]    Culture (A)  Final    <10,000 COLONIES/mL INSIGNIFICANT GROWTH Performed at [facility], [place] [address] [place], [place] [address]    Report Status 02/11/2020 FINAL  Final  Expectorated sputum assessment w rflx to resp cult     Status: None   Collection Time: 02/12/20  2:15 PM   Specimen: Expectorated Sputum  Result Value Ref Range Status   Specimen Description EXPECTORATED  SPUTUM  Final   Special Requests NONE  Final   Sputum evaluation   Final    Sputum specimen not acceptable for testing.  Please recollect.   C/BONNYE ROSE AT 1600 02/12/20.PMF Performed at [facility], [address] [place], [place] [address]    Report Status 02/12/2020 FINAL  Final  Expectorated sputum assessment w rflx to resp cult     Status: None   Collection Time: 02/12/20  6:02 PM  Result Value Ref Range Status   Specimen Description EXPECTORATED SPUTUM  Final   Special Requests NONE  Final   Sputum evaluation   Final    THIS SPECIMEN IS ACCEPTABLE FOR SPUTUM CULTURE Performed at [facility], [address], [place], [place] [address]    Report Status 02/12/2020 FINAL  Final  Culture, respiratory     Status: None (Preliminary result)   Collection Time: 02/12/20  6:02 PM  Result Value Ref Range Status   Specimen Description   Final    EXPECTORATED SPUTUM Performed at [facility], [address], [place], [place] [address]    Special Requests   Final    NONE Reflexed from [id number] Performed at [facility], [address] [place],  [address]    Gram Stain   Final    FEW WBC PRESENT, PREDOMINANTLY PMN RARE GRAM POSITIVE COCCI IN CLUSTERS    Culture   Final    CULTURE REINCUBATED FOR BETTER GROWTH Performed at [facility], [place] [address] [place],  [address]    Report Status PENDING  Incomplete    Radiology Studies: No results found.   Scheduled Meds: . ALPRAZolam  0.5 mg Oral TID  . amLODipine  5 mg Oral Daily  . azithromycin  500 mg Oral Daily  . enoxaparin (LOVENOX) injection  40 mg Subcutaneous Q24H  . escitalopram  20 mg Oral Daily  . feeding supplement  237 mL Oral BID BM  . fluticasone furoate-vilanterol  1 puff Inhalation Daily   And  . umeclidinium bromide  1 puff Inhalation Daily  .  methylPREDNISolone (SOLU-MEDROL) injection  40 mg Intravenous Q24H  . metoprolol tartrate  50 mg Oral BID   . sodium chloride flush  3 mL Intravenous Q12H  . sulfamethoxazole-trimethoprim  1 tablet Oral BID   Continuous Infusions: . sodium chloride 75 mL/hr at 02/14/20 0653     LOS: 4 days    Time spent: 25 mins    Ziyah Cordoba, MD [facility]   If 7PM-7AM, please contact night-coverage

## 2020-02-14 NOTE — Progress Notes (Signed)
Transition of Care consult placed to assist wife with discharge needs.

## 2020-02-15 ENCOUNTER — Inpatient Hospital Stay: Payer: BC Managed Care – PPO

## 2020-02-15 ENCOUNTER — Encounter: Payer: Self-pay | Admitting: Family Medicine

## 2020-02-15 DIAGNOSIS — F4322 Adjustment disorder with anxiety: Secondary | ICD-10-CM | POA: Diagnosis not present

## 2020-02-15 DIAGNOSIS — J9601 Acute respiratory failure with hypoxia: Secondary | ICD-10-CM | POA: Diagnosis not present

## 2020-02-15 LAB — CULTURE, BLOOD (ROUTINE X 2)
Culture: NO GROWTH
Culture: NO GROWTH
Special Requests: ADEQUATE

## 2020-02-15 LAB — CBC
HCT: 31.8 % — ABNORMAL LOW (ref 39.0–52.0)
Hemoglobin: 10.6 g/dL — ABNORMAL LOW (ref 13.0–17.0)
MCH: 29.9 pg (ref 26.0–34.0)
MCHC: 33.3 g/dL (ref 30.0–36.0)
MCV: 89.8 fL (ref 80.0–100.0)
Platelets: 333 10*3/uL (ref 150–400)
RBC: 3.54 MIL/uL — ABNORMAL LOW (ref 4.22–5.81)
RDW: 14 % (ref 11.5–15.5)
WBC: 17.1 10*3/uL — ABNORMAL HIGH (ref 4.0–10.5)
nRBC: 0 % (ref 0.0–0.2)

## 2020-02-15 LAB — BODY FLUID CELL COUNT WITH DIFFERENTIAL
Eos, Fluid: 0 %
Lymphs, Fluid: 25 %
Monocyte-Macrophage-Serous Fluid: 4 %
Neutrophil Count, Fluid: 71 %
Total Nucleated Cell Count, Fluid: 4285 cu mm

## 2020-02-15 LAB — BASIC METABOLIC PANEL
Anion gap: 7 (ref 5–15)
BUN: 9 mg/dL (ref 8–23)
CO2: 31 mmol/L (ref 22–32)
Calcium: 8.1 mg/dL — ABNORMAL LOW (ref 8.9–10.3)
Chloride: 92 mmol/L — ABNORMAL LOW (ref 98–111)
Creatinine, Ser: 0.56 mg/dL — ABNORMAL LOW (ref 0.61–1.24)
GFR, Estimated: >60 mL/min (ref >60)
Glucose, Bld: 92 mg/dL (ref 70–99)
Potassium: 4 mmol/L (ref 3.5–5.1)
Sodium: 130 mmol/L — ABNORMAL LOW (ref 135–145)

## 2020-02-15 LAB — PROCALCITONIN: Procalcitonin: <0.1 ng/mL

## 2020-02-15 LAB — BLOOD GAS, ARTERIAL
Acid-Base Excess: 10.9 mmol/L — ABNORMAL HIGH (ref 0.0–2.0)
Bicarbonate: 36.3 mmol/L — ABNORMAL HIGH (ref 20.0–28.0)
FIO2: 1
O2 Saturation: 96.1 %
Patient temperature: 37
pCO2 arterial: 51 mmHg — ABNORMAL HIGH (ref 32.0–48.0)
pH, Arterial: 7.46 — ABNORMAL HIGH (ref 7.350–7.450)
pO2, Arterial: 78 mmHg — ABNORMAL LOW (ref 83.0–108.0)

## 2020-02-15 LAB — MRSA PCR SCREENING: MRSA by PCR: NEGATIVE

## 2020-02-15 LAB — CULTURE, RESPIRATORY W GRAM STAIN: Culture: NORMAL

## 2020-02-15 LAB — BRAIN NATRIURETIC PEPTIDE: B Natriuretic Peptide: 242.3 pg/mL — ABNORMAL HIGH (ref 0.0–100.0)

## 2020-02-15 LAB — GLUCOSE, CAPILLARY: Glucose-Capillary: 173 mg/dL — ABNORMAL HIGH (ref 70–99)

## 2020-02-15 LAB — RESP PANEL BY RT-PCR (FLU A&B, COVID) ARPGX2
Influenza A by PCR: NEGATIVE
Influenza B by PCR: NEGATIVE
SARS Coronavirus 2 by RT PCR: NEGATIVE

## 2020-02-15 LAB — GLUCOSE, PLEURAL OR PERITONEAL FLUID: Glucose, Fluid: 159 mg/dL

## 2020-02-15 LAB — PHOSPHORUS: Phosphorus: 2.6 mg/dL (ref 2.5–4.6)

## 2020-02-15 LAB — MAGNESIUM: Magnesium: 2 mg/dL (ref 1.7–2.4)

## 2020-02-15 LAB — LACTATE DEHYDROGENASE, PLEURAL OR PERITONEAL FLUID: LD, Fluid: 700 U/L — ABNORMAL HIGH (ref 3–23)

## 2020-02-15 MED ORDER — VANCOMYCIN HCL IN DEXTROSE 1-5 GM/200ML-% IV SOLN: 1000.0000 mg | Freq: Two times a day (BID) | INTRAVENOUS | Status: AC

## 2020-02-15 MED ORDER — CLONAZEPAM 0.125 MG PO TBDP: 0.5000 mg | ORAL_TABLET | Freq: Three times a day (TID) | ORAL | Status: DC

## 2020-02-15 MED ORDER — METOPROLOL TARTRATE 25 MG PO TABS: 25.0000 mg | ORAL_TABLET | Freq: Two times a day (BID) | ORAL | Status: DC

## 2020-02-15 MED ORDER — CHLORHEXIDINE GLUCONATE CLOTH 2 % EX PADS
6.0000 | MEDICATED_PAD | Freq: Every day | CUTANEOUS | Status: DC
  Administered 2020-02-15: 17:00:00 6 via TOPICAL

## 2020-02-15 MED ORDER — GUAIFENESIN-DM 100-10 MG/5ML PO SYRP: 5.0000 mL | ORAL_SOLUTION | ORAL | 0 refills | Status: AC | PRN

## 2020-02-15 MED ORDER — SODIUM CHLORIDE 0.9 % IV SOLN: 2.0000 g | Freq: Three times a day (TID) | INTRAVENOUS | Status: AC

## 2020-02-15 MED ORDER — IPRATROPIUM-ALBUTEROL 0.5-2.5 (3) MG/3ML IN SOLN: 3.0000 mL | Freq: Four times a day (QID) | RESPIRATORY_TRACT | Status: DC | PRN

## 2020-02-15 MED ORDER — ALPRAZOLAM 0.5 MG PO TABS: 0.5000 mg | ORAL_TABLET | Freq: Three times a day (TID) | ORAL | 0 refills | Status: AC

## 2020-02-15 MED ORDER — IPRATROPIUM-ALBUTEROL 0.5-2.5 (3) MG/3ML IN SOLN: 3.0000 mL | Freq: Four times a day (QID) | RESPIRATORY_TRACT | Status: AC | PRN

## 2020-02-15 MED ORDER — SODIUM CHLORIDE 0.9 % IV SOLN
2.0000 g | Freq: Three times a day (TID) | INTRAVENOUS | Status: DC
  Administered 2020-02-15 (×2): 2 g via INTRAVENOUS
  Filled 2020-02-15 (×5): qty 2

## 2020-02-15 MED ORDER — METHYLPREDNISOLONE SODIUM SUCC 125 MG IJ SOLR
INTRAMUSCULAR | Status: AC
  Administered 2020-02-15: 06:00:00 125 mg
  Filled 2020-02-15: qty 2

## 2020-02-15 MED ORDER — VANCOMYCIN HCL 1500 MG/300ML IV SOLN
1500.0000 mg | Freq: Once | INTRAVENOUS | Status: AC
  Administered 2020-02-15: 11:00:00 1500 mg via INTRAVENOUS
  Filled 2020-02-15: qty 300

## 2020-02-15 MED ORDER — FUROSEMIDE 10 MG/ML IJ SOLN
20.0000 mg | Freq: Two times a day (BID) | INTRAMUSCULAR | Status: DC
  Administered 2020-02-15 (×2): 20 mg via INTRAVENOUS
  Filled 2020-02-15 (×2): qty 2

## 2020-02-15 MED ORDER — METHYLPREDNISOLONE SODIUM SUCC 125 MG IJ SOLR
60.0000 mg | Freq: Four times a day (QID) | INTRAMUSCULAR | Status: DC
  Administered 2020-02-15 (×2): 60 mg via INTRAVENOUS
  Filled 2020-02-15 (×2): qty 2

## 2020-02-15 MED ORDER — FUROSEMIDE 10 MG/ML IJ SOLN
40.0000 mg | Freq: Once | INTRAMUSCULAR | Status: AC
  Administered 2020-02-15: 08:00:00 40 mg via INTRAVENOUS
  Filled 2020-02-15: qty 4

## 2020-02-15 MED ORDER — MORPHINE SULFATE (PF) 2 MG/ML IV SOLN
INTRAVENOUS | Status: AC
  Administered 2020-02-15: 06:00:00 2 mg via INTRAVENOUS
  Filled 2020-02-15: qty 1

## 2020-02-15 MED ORDER — FUROSEMIDE 10 MG/ML IJ SOLN: 20.0000 mg | Freq: Two times a day (BID) | INTRAMUSCULAR | 0 refills | Status: AC

## 2020-02-15 MED ORDER — METHYLPREDNISOLONE SODIUM SUCC 125 MG IJ SOLR: 60.0000 mg | Freq: Four times a day (QID) | INTRAMUSCULAR | 0 refills | Status: AC

## 2020-02-15 MED ORDER — IOHEXOL 300 MG/ML  SOLN
75.0000 mL | Freq: Once | INTRAMUSCULAR | Status: AC | PRN
  Administered 2020-02-15: 11:00:00 75 mL via INTRAVENOUS

## 2020-02-15 MED ORDER — VANCOMYCIN HCL IN DEXTROSE 1-5 GM/200ML-% IV SOLN
1000.0000 mg | Freq: Two times a day (BID) | INTRAVENOUS | Status: DC
  Filled 2020-02-15 (×3): qty 200

## 2020-02-15 MED ORDER — ALPRAZOLAM 0.5 MG PO TABS: 0.5000 mg | ORAL_TABLET | Freq: Four times a day (QID) | ORAL | Status: DC | PRN

## 2020-02-15 MED ORDER — METOPROLOL TARTRATE 25 MG PO TABS
25.0000 mg | ORAL_TABLET | Freq: Two times a day (BID) | ORAL | Status: AC
Start: 2020-02-15 — End: ?

## 2020-02-15 MED ORDER — METHYLPREDNISOLONE SODIUM SUCC 125 MG IJ SOLR: 60.0000 mg | Freq: Once | INTRAMUSCULAR | Status: DC

## 2020-02-15 NOTE — Clinical Social Work Note (Addendum)
CSW acknowledges consult, "Patient must be able to ambulate up stairs to get into home. Wife with concerns that PT has not been able to ambulate him further than bedside without decompensating and patient is due for possible discharge tomorrow." PT is aware and will follow up.  Charlynn Court, CSW [phone]  10:20 am: Received call from wife. She is requesting a transfer to [facility]. He has an ILD specialist there, Dr. Holley Raring. CSW notified MD and asked him to come by room or call wife. He stated he has consulted PCCM for the call.  Charlynn Court, CSW [phone]

## 2020-02-15 NOTE — Progress Notes (Signed)
PT Cancellation Note  Patient Details Name: Austin Kim MRN: [id number] DOB: 06-03-1955   Cancelled Treatment:     Pt is being transferred to higher level of care. Acute PT will hold session today. Will re-evaluate pt when  more appropriate to participate.    Rushie Chestnut 02/15/2020, 2:24 PM

## 2020-02-15 NOTE — Procedures (Signed)
Interventional Radiology Procedure Note  Procedure: Right thoracentesis  Findings: Please refer to procedural dictation for full description. Small right pleural effusion.  Thoracentesis yielded 350 mL translucent, blood-tinged fluid.  Samples sent to lab as per request.  Complications: None immediate  Estimated Blood Loss: < 5 mL  Recommendations: Immediate post-thoracentesis CXR ordered.   Marliss Coots, MD Pager: [phone]

## 2020-02-15 NOTE — Discharge Summary (Signed)
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  He follows with pulmonology as an outpatient.  He is on chronic steroid therapy but his symptoms got worse after getting tapered.  He is also on Bactrim prophylaxis.  On presentation he was hypoxic more than his baseline.  Patient was admitted for further management, started on IV antibiotics for community-acquired pneumonia.  Patient became hypoxic in the early morning of 02/15/2020.  Chest x-ray showed bilateral pneumonia, increasing right pleural effusion.    Currently on high flow.  After discussing with family, they wanted him to be transferred to [facility].  He has been accepted at [facility].  Accepting  physician is Dr. Tommi Rumps.  Following problems were addressed during his hospitalization:  Acute on chronic hypoxic respiratory failure: Has history of COPD and ILD.  On 2 L of oxygen per minute.  Continue supplemental oxygen.  Currently on high flow. Chest x-ray showed bibasilar pneumonia, right pleural effusion. Covid screen test negative. Pulmonology were following.   Elevated  BNP, started on IV lasix CT chest showed increased moderate to severe bilateral ground-glass opacities with superimposed pulmonary edema/pneumonia.Moderate right pleural effusion ,s/p thoracentesis  Community-acquired pneumonia: Chest x-ray as above.  On  broadened antibiotic therapy with vancomycin, cefepime and azithromycin.    Procalcitonin is nonreassuring.   ILD: History of post Covid fibrosis.  On 2 L of oxygen per  minute at home.  Follows with pulmonology.  Also on chronic Bactrim therapy and chronic steroid therapy. He has been started on IV Solu-Medrol 60 ,g iV Q6 hrs.  COPD: Currently stable.  No wheezes auscultated.  Continue bronchodilators as needed.  Hyponatremia: Stable.  Leukocytosis: Continue to monitor.  Could be possible from chronic steroid use.  BPH: Continue Flomax  Anxiety: On Lexapro, Xanax  Hypertension: Monitor blood pressure.  Continue amlodipine, metoprolol  Poor appetite: Nutrition were  following.    Discharge Diagnoses:  Active Problems:   ILD (interstitial lung disease) ([place])   Hypertension   Hyponatremia   COPD with acute exacerbation ([place])   Acute hypoxemic respiratory failure [facility])    Discharge Instructions  Discharge Instructions    Diet - low sodium heart healthy   Complete by: As directed      Allergies as of 02/15/2020   No Known Allergies     Medication List    STOP taking these medications   predniSONE 20 MG tablet Commonly known as: DELTASONE     TAKE these medications   albuterol 108 (90 Base) MCG/ACT inhaler Commonly known as: VENTOLIN HFA Inhale 2 puffs into the lungs every 6 (six) hours as needed for wheezing or shortness of breath.   ALPRAZolam 0.5 MG tablet Commonly known as: XANAX Take 1 tablet (0.5 mg total) by mouth 3 (three) times daily.   amLODipine 5 MG tablet Commonly known as: NORVASC Take 5 mg by mouth daily.   ceFEPIme 2 g in sodium chloride 0.9 % 100 mL Inject 2 g into the vein every 8 (eight) hours.   escitalopram 20 MG tablet Commonly known as: LEXAPRO Take 20 mg by mouth daily.  furosemide 10 MG/ML injection Commonly known as: LASIX Inject 2 mLs (20 mg total) into the vein 2 (two) times daily.   guaiFENesin 100 MG/5ML Soln Commonly known as: ROBITUSSIN Take 10-15 mLs by mouth every 4 (four) hours as needed for cough or to loosen phlegm.   guaiFENesin-dextromethorphan 100-10 MG/5ML  syrup Commonly known as: ROBITUSSIN DM Take 5 mLs by mouth every 4 (four) hours as needed for cough.   ipratropium-albuterol 0.5-2.5 (3) MG/3ML Soln Commonly known as: DUONEB Take 3 mLs by nebulization every 6 (six) hours as needed.   methylPREDNISolone sodium succinate 125 mg/2 mL injection Commonly known as: SOLU-MEDROL Inject 0.96 mLs (60 mg total) into the vein every 6 (six) hours.   metoprolol tartrate 25 MG tablet Commonly known as: LOPRESSOR Take 1 tablet (25 mg total) by mouth 2 (two) times daily. What changed:   medication strength  how much to take   naproxen sodium 220 MG tablet Commonly known as: ALEVE Take 220-440 mg by mouth 2 (two) times daily as needed (pain).   sulfamethoxazole-trimethoprim 400-80 MG tablet Commonly known as: BACTRIM Take 1 tablet by mouth 2 (two) times daily.   Trelegy Ellipta 100-62.5-25 MCG/INH Aepb Generic drug: Fluticasone-Umeclidin-Vilant Inhale 1 puff into the lungs daily.   vancomycin 1-5 GM/200ML-% Soln Commonly known as: VANCOCIN Inject 200 mLs (1,000 mg total) into the vein every 12 (twelve) hours.       No Known Allergies  Consultations:  Pulmonology   Procedures/Studies: DG Chest 1 View  Result Date: 02/15/2020 CLINICAL DATA:  64 year old male with shortness of breath. On home oxygen. Interstitial lung disease. EXAM: CHEST  1 VIEW COMPARISON:  Chest radiographs 02/10/2020 and earlier. FINDINGS: Portable AP upright view at 0605 hours. Lower lung volumes with increasing confluent bibasilar pulmonary opacity and new right side pleural effusion visible along the right lung periphery and in the apex. Possible small left apical pleural fluid also. Stable cardiac size and mediastinal contours. No pneumothorax. Visualized tracheal air column is within normal limits. No acute osseous abnormality identified. Negative visible bowel gas pattern. IMPRESSION: Chronic interstitial lung disease with progressive bibasilar pneumonia  suspected and increasing right pleural effusion. Electronically Signed   By: Genevie Ann M.D.   On: 02/15/2020 06:31   DG Chest 2 View  Result Date: 02/10/2020 CLINICAL DATA:  Shortness of breath. Chest pain with deep inspiration. Home oxygen use. EXAM: CHEST - 2 VIEW COMPARISON:  11/12/2019.  CTA chest 11/06/2019. FINDINGS: Midline trachea. Mild cardiomegaly. No pleural effusion or pneumothorax. Biapical pleural thickening. Basilar predominant interstitial opacities are similar. Relative sparing of the lung apices. IMPRESSION: Similar basilar predominant interstitial opacities. When compared to 11/06/2019 CT, this is primarily felt to represent post infectious/inflammatory fibrosis. Superimposed acute/subacute pneumonia cannot be excluded. Electronically Signed   By: Abigail Miyamoto M.D.   On: 02/10/2020 11:41   CT CHEST W CONTRAST  Result Date: 02/15/2020 CLINICAL DATA:  Respiratory failure. History of COPD and interstitial lung disease due to post COVID fibrosis. EXAM: CT CHEST WITH CONTRAST TECHNIQUE: Multidetector CT imaging of the chest was performed during intravenous contrast administration. CONTRAST:  88mL OMNIPAQUE IOHEXOL 300 MG/ML  SOLN COMPARISON:  Chest CT dated 11/06/2019 and chest radiograph dated 02/15/2020. FINDINGS: Cardiovascular: The right and left pulmonary arteries are enlarged, measuring 2.5 cm and 2.8 cm respectively, suggestive of pulmonary hypertension. Vascular calcifications are seen in the coronary arteries and aortic arch. The heart is mildly enlarged. No pericardial effusion. Mediastinum/Nodes: Mildly enlarged mediastinal and bilateral hilar lymph nodes appear  similar to prior exam. The thyroid, trachea, and esophagus appear normal. Lungs/Pleura: Paraseptal emphysema is unchanged. Upper Abdomen: There is moderate to severe bilateral ground-glass opacities with inter- and intra-lobular septal line thickening. These findings are increased since 11/06/2019. There is a moderate right  pleural effusion with associated atelectasis. There is no left pleural effusion. There is no pneumothorax. A 1.0 cm cyst is seen in the right hepatic lobe, unchanged. Musculoskeletal: Degenerative changes are seen in the spine. IMPRESSION: 1. Increased moderate to severe bilateral ground-glass opacities with inter- and intra-lobular septal line thickening. These findings likely reflect chronic interstitial lung disease with superimposed pulmonary edema/pneumonia. 2. Moderate right pleural effusion with associated atelectasis. 3. Mildly enlarged mediastinal and bilateral hilar lymph nodes, likely reactive. 4. Enlarged right and left pulmonary arteries, suggestive of pulmonary hypertension. Aortic Atherosclerosis (ICD10-I70.0) and Emphysema (ICD10-J43.9). Electronically Signed   By: Zerita Boers M.D.   On: 02/15/2020 11:17   DG Chest Port 1 View  Result Date: 02/15/2020 CLINICAL DATA:  Dyspnea. EXAM: PORTABLE CHEST 1 VIEW COMPARISON:  Same day. FINDINGS: Stable cardiomediastinal silhouette. Stable bilateral lung opacities are noted which may represent underlying chronic interstitial lung disease with superimposed inflammation. No pneumothorax is noted. Small pleural effusions cannot be excluded. Bony thorax is unremarkable. IMPRESSION: Stable bilateral lung opacities are noted which may represent underlying chronic interstitial lung disease with superimposed inflammation. Electronically Signed   By: Marijo Conception M.D.   On: 02/15/2020 15:46   US THORACENTESIS ASP PLEURAL SPACE W/IMG GUIDE  Result Date: 02/15/2020 INDICATION: 64 year old male with right pleural effusion EXAM: ULTRASOUND GUIDED RIGHT THORACENTESIS MEDICATIONS: None. COMPLICATIONS: None immediate. PROCEDURE: An ultrasound guided thoracentesis was thoroughly discussed with the patient and questions answered. The benefits, risks, alternatives and complications were also discussed. The patient understands and wishes to proceed with the  procedure. Written consent was obtained. Ultrasound was performed to localize and mark an adequate pocket of fluid in the right chest. The area was then prepped and draped in the normal sterile fashion. 1% Lidocaine was used for local anesthesia. Under ultrasound guidance a 19 gauge, 7-cm, Yueh catheter was introduced. Thoracentesis was performed. The catheter was removed and a dressing applied. FINDINGS: A total of approximately 350 mL of serosanguineous fluid was removed. Samples were sent to the laboratory as requested by the clinical team. IMPRESSION: Successful ultrasound guided right thoracentesis yielding 350 mL of pleural fluid. Ruthann Cancer, MD [facility] Electronically Signed   By: Ruthann Cancer MD   On: 02/15/2020 16:16       Subjective: Patient seen and examined at the bedside this morning.  After discussion with [facility] pulmonology team, he will be transferred whenever bed is available.  Discharge Exam: Vitals:   02/15/20 1508 02/15/20 1609  BP: 115/69 97/74  Pulse: 89   Resp: 16   Temp:  98.2 F (36.8 C)  SpO2: 95%    Vitals:   02/15/20 0750 02/15/20 1151 02/15/20 1508 02/15/20 1609  BP: 126/62 98/65 115/69 97/74  Pulse: 94 87 89   Resp: 18 16 16    Temp: 98 F (36.7 C) 98.4 F (36.9 C)  98.2 F (36.8 C)  TempSrc: Oral Oral  Axillary  SpO2: 100% 94% 95%   Weight:    69.4 kg  Height:    5\' 9"  (1.753 m)    General: Pt is alert, awake, in moderate respiratory distress Cardiovascular: RRR, S1/S2 +, no rubs, no gallops Respiratory: Diminished air sounds bilaterally Abdominal: Soft,  NT, ND, bowel sounds + Extremities: no edema, no cyanosis    The results of significant diagnostics from this hospitalization (including imaging, microbiology, ancillary and laboratory) are listed below for reference.     Microbiology: Recent Results (from the past 240 hour(s))  Culture, blood (Routine X 2) w Reflex to ID Panel      Status: None   Collection Time: 02/10/20  1:13 PM   Specimen: BLOOD  Result Value Ref Range Status   Specimen Description BLOOD RIGHT ANTECUBITAL  Final   Special Requests   Final    BOTTLES DRAWN AEROBIC AND ANAEROBIC Blood Culture results may not be optimal due to an inadequate volume of blood received in culture bottles   Culture   Final    NO GROWTH 5 DAYS Performed at [facility], [address], [place], [place] [address]    Report Status 02/15/2020 FINAL  Final  Culture, blood (Routine X 2) w Reflex to ID Panel     Status: None   Collection Time: 02/10/20  1:13 PM   Specimen: BLOOD  Result Value Ref Range Status   Specimen Description BLOOD LEFT ANTECUBITAL  Final   Special Requests   Final    BOTTLES DRAWN AEROBIC AND ANAEROBIC Blood Culture adequate volume   Culture   Final    NO GROWTH 5 DAYS Performed at [facility], [address] [place], [place] [address]    Report Status 02/15/2020 FINAL  Final  Resp Panel by RT-PCR (Flu A&B, Covid) Nasopharyngeal Swab     Status: None   Collection Time: 02/10/20  3:19 PM   Specimen: Nasopharyngeal Swab; Nasopharyngeal(NP) swabs in vial transport medium  Result Value Ref Range Status   SARS Coronavirus 2 by RT PCR NEGATIVE NEGATIVE Final    Comment: (NOTE) SARS-CoV-2 target nucleic acids are NOT DETECTED.  The SARS-CoV-2 RNA is generally detectable in upper respiratory specimens during the acute phase of infection. The lowest concentration of SARS-CoV-2 viral copies this assay can detect is 138 copies/mL. A negative result does not preclude SARS-Cov-2 infection and should not be used as the sole basis for treatment or other patient management decisions. A negative result may occur with  improper specimen collection/handling, submission of specimen other than nasopharyngeal swab, presence of viral mutation(s) within the areas targeted by this assay, and inadequate number of viral copies(<138 copies/mL).  A negative result must be combined with clinical observations, patient history, and epidemiological information. The expected result is Negative.  Fact Sheet for Patients:  EntrepreneurPulse.com.au  Fact Sheet for Healthcare Providers:  IncredibleEmployment.be  This test is no t yet approved or cleared by the [place] FDA and  has been authorized for detection and/or diagnosis of SARS-CoV-2 by FDA under an Emergency Use Authorization (EUA). This EUA will remain  in effect (meaning this test can be used) for the duration of the COVID-19 declaration under Section 564(b)(1) of the Act, 21 U.S.C.section 360bbb-3(b)(1), unless the authorization is terminated  or revoked sooner.       Influenza A by PCR NEGATIVE NEGATIVE Final   Influenza B by PCR NEGATIVE NEGATIVE Final    Comment: (NOTE) The Xpert Xpress SARS-CoV-2/FLU/RSV plus assay is intended as an aid in the diagnosis of influenza from Nasopharyngeal swab specimens and should not be used as a sole basis for treatment. Nasal washings and aspirates are unacceptable for Xpert Xpress SARS-CoV-2/FLU/RSV testing.  Fact Sheet for Patients: EntrepreneurPulse.com.au  Fact Sheet for Healthcare Providers: IncredibleEmployment.be  This test is  not yet approved or cleared by the [place] and has been authorized for detection and/or diagnosis of SARS-CoV-2 by FDA under an Emergency Use Authorization (EUA). This EUA will remain in effect (meaning this test can be used) for the duration of the COVID-19 declaration under Section 564(b)(1) of the Act, 21 U.S.C. section 360bbb-3(b)(1), unless the authorization is terminated or revoked.  Performed at [facility], [address] [place], [place] [address]   Urine Culture     Status: Abnormal   Collection Time: 02/10/20  4:08 PM   Specimen: Urine, Random  Result Value Ref Range Status    Specimen Description   Final    URINE, RANDOM Performed at [facility], [address], [place], [place] [address]    Special Requests   Final    NONE Performed at [facility], [address] [place], [place] [address]    Culture (A)  Final    <10,000 COLONIES/mL INSIGNIFICANT GROWTH Performed at [facility], [place] [address] [place], [place] [address]    Report Status 02/11/2020 FINAL  Final  Expectorated sputum assessment w rflx to resp cult     Status: None   Collection Time: 02/12/20  2:15 PM   Specimen: Expectorated Sputum  Result Value Ref Range Status   Specimen Description EXPECTORATED SPUTUM  Final   Special Requests NONE  Final   Sputum evaluation   Final    Sputum specimen not acceptable for testing.  Please recollect.   C/BONNYE ROSE AT 1600 02/12/20.PMF Performed at [facility], [address] [place], [place] [address]    Report Status 02/12/2020 FINAL  Final  Expectorated sputum assessment w rflx to resp cult     Status: None   Collection Time: 02/12/20  6:02 PM  Result Value Ref Range Status   Specimen Description EXPECTORATED SPUTUM  Final   Special Requests NONE  Final   Sputum evaluation   Final    THIS SPECIMEN IS ACCEPTABLE FOR SPUTUM CULTURE Performed at [facility], [address], [place], [place] [address]    Report Status 02/12/2020 FINAL  Final  Culture, respiratory     Status: None   Collection Time: 02/12/20  6:02 PM  Result Value Ref Range Status   Specimen Description   Final    EXPECTORATED SPUTUM Performed at [facility], [address] [place], [place] [address]    Special Requests   Final    NONE Reflexed from [phone] Performed at [facility], [address] [place], [place] [address]    Gram Stain   Final    FEW WBC PRESENT, PREDOMINANTLY PMN RARE GRAM POSITIVE COCCI IN CLUSTERS    Culture   Final    MODERATE Normal respiratory flora-no Staph aureus or  Pseudomonas seen Performed at [facility], [place] [address] [place],  [address]    Report Status 02/15/2020 FINAL  Final  Resp Panel by RT-PCR (Flu A&B, Covid) Nasopharyngeal Swab     Status: None   Collection Time: 02/15/20  6:37 AM   Specimen: Nasopharyngeal Swab; Nasopharyngeal(NP) swabs in vial transport medium  Result Value Ref Range Status   SARS Coronavirus 2 by RT PCR NEGATIVE NEGATIVE Final    Comment: (NOTE) SARS-CoV-2 target nucleic acids are NOT DETECTED.  The SARS-CoV-2 RNA is generally detectable in upper respiratory specimens during the acute phase of infection. The lowest concentration of SARS-CoV-2 viral copies this assay can detect is  138 copies/mL. A negative result does not preclude SARS-Cov-2 infection and should not be used as the sole basis for treatment or other patient management decisions. A negative result may occur with  improper specimen collection/handling, submission of specimen other than nasopharyngeal swab, presence of viral mutation(s) within the areas targeted by this assay, and inadequate number of viral copies(<138 copies/mL). A negative result must be combined with clinical observations, patient history, and epidemiological information. The expected result is Negative.  Fact Sheet for Patients:  BloggerCourse.com  Fact Sheet for Healthcare Providers:  SeriousBroker.it  This test is no t yet approved or cleared by the [place] FDA and  has been authorized for detection and/or diagnosis of SARS-CoV-2 by FDA under an Emergency Use Authorization (EUA). This EUA will remain  in effect (meaning this test can be used) for the duration of the COVID-19 declaration under Section 564(b)(1) of the Act, 21 U.S.C.section 360bbb-3(b)(1), unless the authorization is terminated  or revoked sooner.       Influenza A by PCR NEGATIVE NEGATIVE Final   Influenza B by PCR NEGATIVE NEGATIVE  Final    Comment: (NOTE) The Xpert Xpress SARS-CoV-2/FLU/RSV plus assay is intended as an aid in the diagnosis of influenza from Nasopharyngeal swab specimens and should not be used as a sole basis for treatment. Nasal washings and aspirates are unacceptable for Xpert Xpress SARS-CoV-2/FLU/RSV testing.  Fact Sheet for Patients: BloggerCourse.com  Fact Sheet for Healthcare Providers: SeriousBroker.it  This test is not yet approved or cleared by the [place] FDA and has been authorized for detection and/or diagnosis of SARS-CoV-2 by FDA under an Emergency Use Authorization (EUA). This EUA will remain in effect (meaning this test can be used) for the duration of the COVID-19 declaration under Section 564(b)(1) of the Act, 21 U.S.C. section 360bbb-3(b)(1), unless the authorization is terminated or revoked.  Performed at [facility], [address] [place], [place] [address]   MRSA PCR Screening     Status: None   Collection Time: 02/15/20  9:52 AM   Specimen: Nasal Mucosa; Nasopharyngeal  Result Value Ref Range Status   MRSA by PCR NEGATIVE NEGATIVE Final    Comment:        The GeneXpert MRSA Assay (FDA approved for NASAL specimens only), is one component of a comprehensive MRSA colonization surveillance program. It is not intended to diagnose MRSA infection nor to guide or monitor treatment for MRSA infections. Performed at [facility], [address] [place], [place] [address]      Labs: BNP (last 3 results) Recent Labs    11/06/19 1729 02/15/20 0510  BNP 121.9* 242.3*   Basic Metabolic Panel: Recent Labs  Lab 02/11/20 0549 02/12/20 0327 02/13/20 0451 02/14/20 0553 02/15/20 0510  NA 129* 131* 128* 130* 130*  K 4.7 4.6 4.3 3.8 4.0  CL 97* 94* 92* 95* 92*  CO2 24 30 31 29 31   GLUCOSE 121* 126* 90 96 92  BUN 8 14 15 13 9   CREATININE 0.51* 0.66 0.66 0.48* 0.56*  CALCIUM 8.5* 8.6*  8.1* 7.9* 8.1*  MG  --  2.1 2.0 1.9 2.0  PHOS  --  2.8 2.4* 2.5 2.6   Liver Function Tests: Recent Labs  Lab 02/11/20 0549  AST 18  ALT 19  ALKPHOS 47  BILITOT 0.8  PROT 5.8*  ALBUMIN 2.5*   No results for input(s): LIPASE, AMYLASE in the last 168 hours. No results for input(s): AMMONIA in the last 168 hours. CBC:  Recent Labs  Lab 02/11/20 0549 02/12/20 0327 02/13/20 0451 02/14/20 0553 02/15/20 0510  WBC 13.5* 18.6* 16.8* 15.8* 17.1*  HGB 11.4* 11.2* 10.8* 10.1* 10.6*  HCT 32.6* 33.8* 32.6* 30.8* 31.8*  MCV 87.4 89.2 90.1 89.5 89.8  PLT 367 407* 362 331 333   Cardiac Enzymes: No results for input(s): CKTOTAL, CKMB, CKMBINDEX, TROPONINI in the last 168 hours. BNP: Invalid input(s): POCBNP CBG: Recent Labs  Lab 02/15/20 1558  GLUCAP 173*   D-Dimer No results for input(s): DDIMER in the last 72 hours. Hgb A1c No results for input(s): HGBA1C in the last 72 hours. Lipid Profile No results for input(s): CHOL, HDL, LDLCALC, TRIG, CHOLHDL, LDLDIRECT in the last 72 hours. Thyroid function studies No results for input(s): TSH, T4TOTAL, T3FREE, THYROIDAB in the last 72 hours.  Invalid input(s): FREET3 Anemia work up No results for input(s): VITAMINB12, FOLATE, FERRITIN, TIBC, IRON, RETICCTPCT in the last 72 hours. Urinalysis    Component Value Date/Time   COLORURINE YELLOW (A) 11/06/2019 2224   APPEARANCEUR CLEAR (A) 11/06/2019 2224   LABSPEC 1.039 (H) 11/06/2019 2224   PHURINE 6.0 11/06/2019 2224   GLUCOSEU NEGATIVE 11/06/2019 2224   HGBUR NEGATIVE 11/06/2019 2224   BILIRUBINUR NEGATIVE 11/06/2019 2224   KETONESUR 20 (A) 11/06/2019 2224   PROTEINUR NEGATIVE 11/06/2019 2224   NITRITE NEGATIVE 11/06/2019 2224   LEUKOCYTESUR NEGATIVE 11/06/2019 2224   Sepsis Labs Invalid input(s): PROCALCITONIN,  WBC,  LACTICIDVEN Microbiology Recent Results (from the past 240 hour(s))  Culture, blood (Routine X 2) w Reflex to ID Panel     Status: None   Collection Time:  02/10/20  1:13 PM   Specimen: BLOOD  Result Value Ref Range Status   Specimen Description BLOOD RIGHT ANTECUBITAL  Final   Special Requests   Final    BOTTLES DRAWN AEROBIC AND ANAEROBIC Blood Culture results may not be optimal due to an inadequate volume of blood received in culture bottles   Culture   Final    NO GROWTH 5 DAYS Performed at [facility], [address] [place], [place] [address]    Report Status 02/15/2020 FINAL  Final  Culture, blood (Routine X 2) w Reflex to ID Panel     Status: None   Collection Time: 02/10/20  1:13 PM   Specimen: BLOOD  Result Value Ref Range Status   Specimen Description BLOOD LEFT ANTECUBITAL  Final   Special Requests   Final    BOTTLES DRAWN AEROBIC AND ANAEROBIC Blood Culture adequate volume   Culture   Final    NO GROWTH 5 DAYS Performed at [facility], [address] [place],  [address]    Report Status 02/15/2020 FINAL  Final  Resp Panel by RT-PCR (Flu A&B, Covid) Nasopharyngeal Swab     Status: None   Collection Time: 02/10/20  3:19 PM   Specimen: Nasopharyngeal Swab; Nasopharyngeal(NP) swabs in vial transport medium  Result Value Ref Range Status   SARS Coronavirus 2 by RT PCR NEGATIVE NEGATIVE Final    Comment: (NOTE) SARS-CoV-2 target nucleic acids are NOT DETECTED.  The SARS-CoV-2 RNA is generally detectable in upper respiratory specimens during the acute phase of infection. The lowest concentration of SARS-CoV-2 viral copies this assay can detect is 138 copies/mL. A negative result does not preclude SARS-Cov-2 infection and should not be used as the sole basis for treatment or other patient management decisions. A negative result may occur with  improper specimen collection/handling, submission of specimen other than nasopharyngeal  swab, presence of viral mutation(s) within the areas targeted by this assay, and inadequate number of viral copies(<138 copies/mL). A negative result must be combined  with clinical observations, patient history, and epidemiological information. The expected result is Negative.  Fact Sheet for Patients:  EntrepreneurPulse.com.au  Fact Sheet for Healthcare Providers:  IncredibleEmployment.be  This test is no t yet approved or cleared by the [place] FDA and  has been authorized for detection and/or diagnosis of SARS-CoV-2 by FDA under an Emergency Use Authorization (EUA). This EUA will remain  in effect (meaning this test can be used) for the duration of the COVID-19 declaration under Section 564(b)(1) of the Act, 21 U.S.C.section 360bbb-3(b)(1), unless the authorization is terminated  or revoked sooner.       Influenza A by PCR NEGATIVE NEGATIVE Final   Influenza B by PCR NEGATIVE NEGATIVE Final    Comment: (NOTE) The Xpert Xpress SARS-CoV-2/FLU/RSV plus assay is intended as an aid in the diagnosis of influenza from Nasopharyngeal swab specimens and should not be used as a sole basis for treatment. Nasal washings and aspirates are unacceptable for Xpert Xpress SARS-CoV-2/FLU/RSV testing.  Fact Sheet for Patients: EntrepreneurPulse.com.au  Fact Sheet for Healthcare Providers: IncredibleEmployment.be  This test is not yet approved or cleared by the [place] FDA and has been authorized for detection and/or diagnosis of SARS-CoV-2 by FDA under an Emergency Use Authorization (EUA). This EUA will remain in effect (meaning this test can be used) for the duration of the COVID-19 declaration under Section 564(b)(1) of the Act, 21 U.S.C. section 360bbb-3(b)(1), unless the authorization is terminated or revoked.  Performed at [facility], [address], [place], [place] [address]   Urine Culture     Status: Abnormal   Collection Time: 02/10/20  4:08 PM   Specimen: Urine, Random  Result Value Ref Range Status   Specimen Description   Final    URINE,  RANDOM Performed at [facility], [address] [place], [place] [address]    Special Requests   Final    NONE Performed at [facility], [address] [place], [place] [address]    Culture (A)  Final    <10,000 COLONIES/mL INSIGNIFICANT GROWTH Performed at [facility], [place] [address] [place], [place] [address]    Report Status 02/11/2020 FINAL  Final  Expectorated sputum assessment w rflx to resp cult     Status: None   Collection Time: 02/12/20  2:15 PM   Specimen: Expectorated Sputum  Result Value Ref Range Status   Specimen Description EXPECTORATED SPUTUM  Final   Special Requests NONE  Final   Sputum evaluation   Final    Sputum specimen not acceptable for testing.  Please recollect.   C/BONNYE ROSE AT 1600 02/12/20.PMF Performed at [facility], [address] [place], [place] [address]    Report Status 02/12/2020 FINAL  Final  Expectorated sputum assessment w rflx to resp cult     Status: None   Collection Time: 02/12/20  6:02 PM  Result Value Ref Range Status   Specimen Description EXPECTORATED SPUTUM  Final   Special Requests NONE  Final   Sputum evaluation   Final    THIS SPECIMEN IS ACCEPTABLE FOR SPUTUM CULTURE Performed at [facility], [address] [place], [place] [address]    Report Status 02/12/2020 FINAL  Final  Culture, respiratory     Status: None   Collection Time: 02/12/20  6:02 PM  Result  Value Ref Range Status   Specimen Description   Final    EXPECTORATED SPUTUM Performed at [facility], [address] [place], [place] [address]    Special Requests   Final    NONE Reflexed from [phone] Performed at [facility], [address] [place], [place] [address]    Gram Stain   Final    FEW WBC PRESENT, PREDOMINANTLY PMN RARE GRAM POSITIVE COCCI IN CLUSTERS    Culture   Final    MODERATE Normal respiratory flora-no Staph aureus or Pseudomonas seen Performed at [facility] [address], [place], [place] [address]    Report Status 02/15/2020 FINAL  Final  Resp Panel by RT-PCR (Flu A&B, Covid) Nasopharyngeal Swab     Status: None   Collection Time: 02/15/20  6:37 AM   Specimen: Nasopharyngeal Swab; Nasopharyngeal(NP) swabs in vial transport medium  Result Value Ref Range Status   SARS Coronavirus 2 by RT PCR NEGATIVE NEGATIVE Final    Comment: (NOTE) SARS-CoV-2 target nucleic acids are NOT DETECTED.  The SARS-CoV-2 RNA is generally detectable in upper respiratory specimens during the acute phase of infection. The lowest concentration of SARS-CoV-2 viral copies this assay can detect is 138 copies/mL. A negative result does not preclude SARS-Cov-2 infection and should not be used as the sole basis for treatment or other patient management decisions. A negative result may occur with  improper specimen collection/handling, submission of specimen other than nasopharyngeal swab, presence of viral mutation(s) within the areas targeted by this assay, and inadequate number of viral copies(<138 copies/mL). A negative result must be combined with clinical observations, patient history, and epidemiological information. The expected result is Negative.  Fact Sheet for Patients:  EntrepreneurPulse.com.au  Fact Sheet for Healthcare Providers:  IncredibleEmployment.be  This test is no t yet approved or cleared by the [place] FDA and  has been authorized for detection and/or diagnosis of SARS-CoV-2 by FDA under an Emergency Use Authorization (EUA). This EUA will remain  in effect (meaning this test can be used) for the duration of the COVID-19 declaration under Section 564(b)(1) of the Act, 21 U.S.C.section 360bbb-3(b)(1), unless the authorization is terminated  or revoked sooner.       Influenza A by PCR NEGATIVE NEGATIVE Final   Influenza B by PCR NEGATIVE NEGATIVE Final    Comment: (NOTE) The Xpert Xpress  SARS-CoV-2/FLU/RSV plus assay is intended as an aid in the diagnosis of influenza from Nasopharyngeal swab specimens and should not be used as a sole basis for treatment. Nasal washings and aspirates are unacceptable for Xpert Xpress SARS-CoV-2/FLU/RSV testing.  Fact Sheet for Patients: EntrepreneurPulse.com.au  Fact Sheet for Healthcare Providers: IncredibleEmployment.be  This test is not yet approved or cleared by the [place] FDA and has been authorized for detection and/or diagnosis of SARS-CoV-2 by FDA under an Emergency Use Authorization (EUA). This EUA will remain in effect (meaning this test can be used) for the duration of the COVID-19 declaration under Section 564(b)(1) of the Act, 21 U.S.C. section 360bbb-3(b)(1), unless the authorization is terminated or revoked.  Performed at [facility], [address] [place], [place] [address]   MRSA PCR Screening     Status: None   Collection Time: 02/15/20  9:52 AM   Specimen: Nasal Mucosa; Nasopharyngeal  Result Value Ref Range Status   MRSA by PCR NEGATIVE NEGATIVE Final    Comment:        The GeneXpert MRSA Assay (FDA approved for  NASAL specimens only), is one component of a comprehensive MRSA colonization surveillance program. It is not intended to diagnose MRSA infection nor to guide or monitor treatment for MRSA infections. Performed at [facility], [address] [place], [place] [address]     Please note: You were cared for by a hospitalist during your hospital stay. Once you are discharged, your primary care physician will handle any further medical issues. Please note that NO REFILLS for any discharge medications will be authorized once you are discharged, as it is imperative that you return to your primary care physician (or establish a relationship with a primary care physician if you do not have one) for your post hospital discharge needs so that  they can reassess your need for medications and monitor your lab values.    Time coordinating discharge: 40 minutes  SIGNED:   Shelly Coss, MD  [facility] 02/15/2020, 4:30 PM Pager [phone]  If 7PM-7AM, please contact night-coverage [email or web address] Password [email or web address]

## 2020-02-15 NOTE — Progress Notes (Signed)
OT Cancellation Note  Patient Details Name: Austin Kim MRN: [id number] DOB: [birth year]   Cancelled Treatment:    Reason Eval/Treat Not Completed: Medical issues which prohibited therapy  Pt t/f'ed to higher level of care. D/t change in medical status, will complete occupational therapy order at this time. Thank you.   Rejeana Brock, MS, OTR/L ascom [phone] 02/15/20, 4:47 PM

## 2020-02-15 NOTE — Progress Notes (Signed)
Pulmonary Medicine          Date: 02/15/2020,   MRN# [id number] JARONE OSTERGAARD III 1955-08-26     AdmissionWeight: 68.7 kg                 CurrentWeight: 68.7 kg   Referring physician Dr. Fanny Bien   CHIEF COMPLAINT:   Acute exacerbation of interstitial lung disease.   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  Seen in outpatient pulmonary clinic with mild improvement had additional evaluation at [facility] with ILD clinic thought to have possible ILD flare and perhaps initiation of antifibrotic therapy at some point.  States he had 1 day of diarrhea 1 day prior to admission no fevers, denies hemoptysis, still has chronic cough which is dry with costochondritis.  He is also noted to have hyponatremia at 118 which is worsened from previous which was 128 in September during his admission.  He did admit to taking multiple doses of NSAIDs including naproxen twice daily.  And recently started Lexapro due to ongoing general anxiety disorder refractory to Valium at home.  He had chest x-ray done here which is improved compared to previous in September mostly with interstitial basal predominant opacities consistent with known pulmonary fibrosis.  There is no pneumothorax, no consolidated infiltrate, no signs of pleural effusion, pulmonary edema.  Blood work is not consistent with infection, pulmonary consultation placed for additional management of ILD flare.   -02/12/20- patient is improved. We will decrease steroids to once daily 40mg .  Have dcd all  narcotics today.  He was unable to sleep last night.  02/13/20- patient was having difficulty today, he feels more dyspneic (although O2 requirement is at baseline 2L/min), he was having difficulty standing up and could not walk to commode. Discussed with Dr 02/15/20 today.  02/15/20- patient noted to have increased O2 requirement.  His antibiotic regimen has been broadened and an additional dose of steroids IV was delivered.  He had repeat CT chest today we reviewed this together and pictorial documentation is included below. There is significant GGO bilaterally on top of previous areas of basilar fibrosis with mod R pleural effusion and mediastinal lymphadenopathy.  We discussed continued diuresis, and continued anti-inflammatory therapy via IV steroids for what seems to be an exacerbation of underlying interstitial lung disease.   PAST MEDICAL HISTORY   Past Medical History:  Diagnosis Date  . Blindness of right eye   . COPD (chronic obstructive pulmonary disease) (HCC)   . Elevated lipids   . Hx of basal cell carcinoma 02/05/2010   L temple  . Hx of dysplastic nevus 04/12/2019   R spinal mid back, moderate to severe atypia, excised 05/09/2019  . Hypertension   . Spontaneous pneumothorax   . Squamous cell carcinoma of skin 04/12/2019   Posterior Crown, SCC IS     SURGICAL HISTORY   Past Surgical History:  Procedure Laterality Date  . BASAL CELL CARCINOMA EXCISION Bilateral   . COLONOSCOPY WITH PROPOFOL N/A 07/28/2017   Procedure: COLONOSCOPY WITH PROPOFOL;  Surgeon: 09/27/2017, MD;  Location: [facility] ENDOSCOPY;  Service: Endoscopy;  Laterality: N/A;  . EYE SURGERY    . PLEURAL SCARIFICATION       FAMILY HISTORY   Family History  Problem Relation Age of Onset  . Diabetes Father   . CAD Father   . Colon cancer Father   . Heart disease Father        Heart transplant  . Diabetes Brother        Type 1 DM     SOCIAL HISTORY   Social History   Tobacco Use  . Smoking  status: Former Smoker    Quit date: 2001    Years since quitting: 21.0  . Smokeless tobacco: Never Used  . Tobacco comment: 2 PPD for ~20 years  Vaping Use  . Vaping Use: Never used  Substance Use Topics  . Alcohol use: Yes  . Drug use: No     MEDICATIONS    Home Medication:    Current Medication:  Current Facility-Administered Medications:  .  ALPRAZolam Duanne Moron) tablet 0.5 mg, 0.5 mg, Oral, TID, Romelle Reiley, MD, 0.5 mg at 02/14/20 2211 .  amLODipine (NORVASC) tablet 5 mg, 5 mg, Oral, Daily, Clarnce Flock, MD, 5 mg at 02/12/20 1052 .  azithromycin (ZITHROMAX) tablet 500 mg, 500 mg, Oral, Daily, Clarnce Flock, MD, 500 mg at 02/14/20 0916 .  enoxaparin (LOVENOX) injection 40 mg, 40 mg, Subcutaneous, Q24H, Clarnce Flock, MD, 40 mg at 02/14/20 2212 .  escitalopram (LEXAPRO) tablet 20 mg, 20 mg, Oral, Daily, Clarnce Flock, MD, 20 mg at 02/14/20 [id number] .  feeding supplement (ENSURE ENLIVE / ENSURE PLUS) liquid 237 mL, 237 mL, Oral, BID BM, Shawna Clamp, MD, 237 mL at 02/14/20 1045 .  fluticasone furoate-vilanterol (BREO ELLIPTA) 100-25 MCG/INH 1 puff, 1 puff, Inhalation, Daily, 1 puff at 02/14/20 0924 **AND** umeclidinium bromide (INCRUSE ELLIPTA) 62.5 MCG/INH 1 puff, 1 puff, Inhalation, Daily, Clarnce Flock, MD, 1 puff at 02/14/20 [id number] .  furosemide (LASIX) injection 40 mg, 40 mg, Intravenous, Once, Ouma, Bing Neighbors, NP .  guaiFENesin-dextromethorphan (ROBITUSSIN DM) 100-10 MG/5ML syrup 5 mL, 5 mL, Oral, Q4H PRN, Lang Snow, NP, 5 mL at 02/14/20 2212 .  ipratropium-albuterol (DUONEB) 0.5-2.5 (3) MG/3ML nebulizer solution 3 mL, 3 mL, Nebulization, Q6H PRN, Ouma, Bing Neighbors, NP .  methylPREDNISolone sodium succinate (SOLU-MEDROL) 125 mg/2 mL injection 60 mg, 60 mg, Intravenous, Once, Ouma, Bing Neighbors, NP .  methylPREDNISolone sodium succinate (SOLU-MEDROL) 40 mg/mL injection 40 mg, 40 mg, Intravenous, Q24H, Lanney Gins, Dulse Rutan, MD,  40 mg at 02/14/20 0653 .  metoprolol tartrate (LOPRESSOR) tablet 50 mg, 50 mg, Oral, BID, Clarnce Flock, MD, 50 mg at 02/14/20 2212 .  morphine 2 MG/ML injection 1 mg, 1 mg, Intravenous, Q3H PRN, Ottie Glazier, MD, 1 mg at 02/14/20 2212 .  ondansetron (ZOFRAN) tablet 4 mg, 4 mg, Oral, Q6H PRN **OR** ondansetron (ZOFRAN) injection 4 mg, 4 mg, Intravenous, Q6H PRN, Clarnce Flock, MD .  sodium chloride flush (NS) 0.9 % injection 3 mL, 3 mL, Intravenous, Q12H, Clarnce Flock, MD, 3 mL at 02/14/20 2212 .  sulfamethoxazole-trimethoprim (BACTRIM) 400-80 MG per tablet 1 tablet, 1 tablet, Oral, BID, Clarnce Flock, MD, 1 tablet at 02/14/20 2212 .  traZODone (DESYREL) tablet 50 mg, 50 mg, Oral, QHS PRN, Clarnce Flock, MD, 50 mg at 02/14/20 2228    ALLERGIES   Patient has no known allergies.     REVIEW OF SYSTEMS    Review of Systems:  Gen:  Denies  fever, sweats, chills weigh loss  HEENT: Denies blurred vision, double vision, ear pain, eye pain, hearing loss, nose bleeds, sore throat Cardiac:  No dizziness, chest pain or heaviness, chest tightness,edema Resp: reports cough chronically   Gi: reports diarreah  Gu:  Reports new onset dribbling of urine Ext:   Denies Joint pain, stiffness or swelling Skin: Denies  skin rash, easy bruising or bleeding or hives Endoc:  Denies polyuria, polydipsia , polyphagia or weight change Psych:   Reports severe anxiety chronically Other:  All other systems negative   VS: BP 136/66   Pulse 89   Temp 98.9 F (37.2 C)   Resp 20   Ht 5\' 9"  (1.753 m)   Wt 68.7 kg   SpO2 99%   BMI 22.37 kg/m      PHYSICAL EXAM    GENERAL:NAD, no fevers, chills, no weakness no fatigue HEAD: Normocephalic, atraumatic.  EYES: Pupils equal, round, reactive to light. Extraocular muscles intact. No scleral icterus.  MOUTH: Moist mucosal membrane. Dentition intact. No abscess noted.  EAR, NOSE, THROAT: Clear without exudates. No external  lesions.  NECK: Supple. No thyromegaly. No nodules. No JVD.  PULMONARY: velcro crepitations at bases bilaterally  CARDIOVASCULAR: S1 and S2. Regular rate and rhythm. No murmurs, rubs, or gallops. No edema. Pedal pulses 2+ bilaterally.  GASTROINTESTINAL: Soft, nontender, nondistended. No masses. Positive bowel sounds. No hepatosplenomegaly.  MUSCULOSKELETAL: No swelling, clubbing, or edema. Range of motion full in all extremities.  NEUROLOGIC: Cranial nerves II through XII are intact. No gross focal neurological deficits. Sensation intact. Reflexes intact.  SKIN: No ulceration, lesions, rashes, or cyanosis. Skin warm and dry. Turgor intact.  PSYCHIATRIC: patient with severe anxiety       IMAGING    DG Chest 1 View  Result Date: 02/15/2020 CLINICAL DATA:  64 year old male with shortness of breath. On home oxygen. Interstitial lung disease. EXAM: CHEST  1 VIEW COMPARISON:  Chest radiographs 02/10/2020 and earlier. FINDINGS: Portable AP upright view at 0605 hours. Lower lung volumes with increasing confluent bibasilar pulmonary opacity and new right side pleural effusion visible along the right lung periphery and in the apex. Possible small left apical pleural fluid also. Stable cardiac size and mediastinal contours. No pneumothorax. Visualized tracheal air column is within normal limits. No acute osseous abnormality identified. Negative visible bowel gas pattern. IMPRESSION: Chronic interstitial lung disease with progressive bibasilar pneumonia suspected and increasing right pleural effusion. Electronically Signed   By: Genevie Ann M.D.   On: 02/15/2020 06:31   DG Chest 2 View  Result Date: 02/10/2020 CLINICAL DATA:  Shortness of breath. Chest pain with deep inspiration. Home oxygen use. EXAM: CHEST - 2 VIEW COMPARISON:  11/12/2019.  CTA chest 11/06/2019. FINDINGS: Midline trachea. Mild cardiomegaly. No pleural effusion or pneumothorax. Biapical pleural thickening. Basilar predominant interstitial  opacities are similar. Relative sparing of the lung apices. IMPRESSION: Similar basilar predominant interstitial opacities. When compared to 11/06/2019 CT, this is primarily felt to represent post infectious/inflammatory fibrosis. Superimposed acute/subacute pneumonia cannot be excluded. Electronically Signed   By: Abigail Miyamoto M.D.   On: 02/10/2020 11:41          ASSESSMENT/PLAN   Acute on chronic hypoxemic respiratory failure due to acute exacerbation of interstitial lung disease -02/15/20-s/p CT chest repeat as above  - patient is at home on 3L [place]- currently on HFNC 7-15L/min  - he respnoded well to IV solumedrol during previous exacerbation and has received solumedrol on presentation to ED. - significant  dyspnea and breathlessness -  IV morphine PRN-patient states this reduces dyspnea signficantly -has been on bactrim for PCP ppx and unlikely has bacterial superinfection.  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 - patient has been taking several doses of NSAIDS daily, he does have increased H2O intake and low Na intake PO as well as initiaion of lexapro on outpatient all of which are likely contributing to hyponatremia - have added TID salt tabs with meals >>Na is improving - patient has poor po intake  -repeat Urine Na   Generalized anxiety disorder  - s/p Valium on outpatient with recent tapering off which may have caused recurrence of anxiety symptoms  - have added 0.5 TID alprazolam PO  - consider psychiatric evaluation    Combined pulmonary fibrosis and emphysema (CPFE)   Patient with bullous emphysema - he is high risk for pneumothorax with  this froceful dry cough, id prefer patient be on some anti-tussive to prevent worsening costochondritis, pneumothorax, and rarely rib and sternal fracture.   - he is on PRN morphine 1mg  and I will add scheduled Hycodan 34ml  -s/p ILD clinic evaluation with plan for possible OFEV therapy at some point in future.     Thank you for allowing me to participate in the care of this patient.   Patient/Family are satisfied with care plan and all questions have been answered.  This document was prepared using Dragon voice recognition software and may include unintentional dictation errors.     Ottie Glazier, M.D.  Division of [facility]

## 2020-02-15 NOTE — Progress Notes (Signed)
Rapid Response Event Note   Reason for Call : SOB   Initial Focused Assessment: Pt in bed, was put on NRB. Had O@ sats in 90s, dropped to 80s      Interventions: Pt placed on NRB O2 sats up to 99   Plan of Care: Will give steroids and morphine will wean oxygen    Event Summary: NP ouma at bedside  MD Notified: ouma np Call [id number] Arrival JJOA:[id number] End Time: 0615 Zaylynn Rickett A, RN

## 2020-02-15 NOTE — Progress Notes (Addendum)
Rapid response initiated for acute hypoxic respiratory failure with mild increased work of breathing. Sats in the low 80's so patient was placed on nonrebreath with immediate improvement in sats to 99%. STAT Chest xray obtained and showed progressive bibasilar pneumonia suspected and increasing right pleural effusion. Labs this am with worsening Leukocytosis. He is currently in Azithromycin.  -Supplemental O2 as needed to maintain O2 saturations 88 to 92% -Follow intermittent ABG and chest x-ray as needed -IV Lasix as blood pressure and renal function permits; currently has received Lasix 40 mg IV x1 -As needed bronchodilators -Continue Steroids -Check Procalcitonin may need broad coverage? -Trend white count -Monitor fever curve     Webb Silversmith, BSN, MSN, DNP, [employer]  [facility] Nurse Practitioner  [facility]

## 2020-02-15 NOTE — Progress Notes (Signed)
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  He follows with pulmonology as an outpatient.  He is on chronic steroid therapy but his symptoms got worse after getting tapered.  He is also on Bactrim prophylaxis.  On presentation he was hypoxic more than his baseline.  Patient was admitted for further management, started on IV antibiotics for community-acquired pneumonia.  Patient became hypoxic in the early morning of 02/15/2020.  Chest x-ray showed bilateral pneumonia, increasing right pleural effusion.  CT chest ordered.  Currently on high flow.  Assessment & Plan:   Active Problems:   ILD (interstitial lung disease) ([place])   Hypertension   Hyponatremia   COPD with acute exacerbation (HCC)   Acute hypoxemic respiratory failure (HCC)  Acute on chronic hypoxic respiratory failure: Has history of COPD and ILD.  On 2 L of oxygen per minute.  Continue supplemental oxygen.  Currently on high flow. Chest x-ray showed bibasilar pneumonia, right pleural effusion Covid screen test negative. Pulmonology following.  Stop IV fluids We will check BNP, given a dose of Lasix IV once  Community-acquired pneumonia: Chest x-ray as above.  Will broaden antibiotic therapy with vancomycin, cefepime and azithromycin.  We will get sputum culture if possible.  Will get CT chest with contrast. Will order right-sided thoracentesis if CT chest shows significant pleural effusion.  Procalcitonin is nonreassuring  ILD: History of post Covid fibrosis.  On 2 L of oxygen per minute at home.  Follows with pulmonology.  Also on chronic Bactrim therapy and chronic steroid therapy. He has been started on IV Solu-Medrol again here.  COPD:  Currently stable.  No wheezes auscultated.  Continue bronchodilators as needed.  Hyponatremia: Stable.  On salt tablets.  Leukocytosis: Continue to monitor.  Could be possible from chronic steroid use.  BPH: Continue Flomax  Anxiety: On Lexapro, Xanax  Hypertension: Monitor blood pressure.  Continue amlodipine, metoprolol  Poor appetite: Nutrition following.   Nutrition Problem: Increased nutrient needs Etiology: chronic illness (COPD, ILD)      DVT prophylaxis:Lovenox Code Status: Full Family Communication: None at bedside Status is: Inpatient  Remains inpatient appropriate because:Inpatient level of care appropriate due to severity of illness   Dispo: The patient is from: Home              Anticipated d/c is to: Home              Anticipated d/c date is: 3 days              Patient currently is not medically stable to d/c.    Consultants: PCCM  Procedures:None  Antimicrobials:  Anti-infectives (From admission, onward)   Start     Dose/Rate Route Frequency Ordered Stop   02/15/20 2100  vancomycin (VANCOCIN) IVPB 1000 mg/200 mL premix        1,000 mg 200 mL/hr over 60 Minutes Intravenous Every 12 hours 02/15/20 0735     02/15/20 0830  vancomycin (VANCOREADY) IVPB 1500 mg/300 mL        1,500 mg 150 mL/hr over 120 Minutes Intravenous  Once 02/15/20 0735     02/15/20 0815  ceFEPIme (MAXIPIME) 2 g in sodium chloride 0.9 % 100 mL IVPB  2 g 200 mL/hr over 30 Minutes Intravenous Every 8 hours 02/15/20 0729     02/11/20 1000  azithromycin (ZITHROMAX) tablet 500 mg  Status:  Discontinued        500 mg Oral Daily 02/10/20 1542 02/15/20 0803   02/10/20 2200  sulfamethoxazole-trimethoprim (BACTRIM) 400-80 MG per tablet 1 tablet  Status:  Discontinued        1 tablet Oral 2 times daily 02/10/20 1845 02/15/20 0803   02/10/20 1300  azithromycin (ZITHROMAX) tablet 500 mg        500 mg Oral  Once 02/10/20 1259 02/10/20 1320      Subjective: Patient seen and examined  at the bedside this morning.  Rapid response called last night because of acute hypoxic respiratory failure.  On high flow oxygen during my evaluation.  Complains of dyspnea but does not look diaphoretic or severely tachypneic.  Lungs auscultation did not reveal any wheezes or crackles  Objective: Vitals:   02/15/20 0053 02/15/20 0526 02/15/20 0555 02/15/20 0750  BP: 117/71 (!) 131/59 136/66 126/62  Pulse: 78 93 89 94  Resp: 18 20  18   Temp: 97.8 F (36.6 C) 98.9 F (37.2 C)  98 F (36.7 C)  TempSrc: Oral   Oral  SpO2: 93% (!) 87% 99% 100%  Weight:      Height:        Intake/Output Summary (Last 24 hours) at 02/15/2020 0814 Last data filed at 02/15/2020 0525 Gross per 24 hour  Intake 480 ml  Output 1970 ml  Net -1490 ml   Filed Weights   02/10/20 2000  Weight: 68.7 kg    Examination:  General exam: In mild to moderate respiratory distress HEENT:PERRL,Oral mucosa moist, Ear/Nose normal on gross exam Respiratory system: Bilateral diminished air sounds, no wheezes or crackles due to noise in the room Cardiovascular system: S1 & S2 heard, RRR. No JVD, murmurs, rubs, gallops or clicks. No pedal edema. Gastrointestinal system: Abdomen is nondistended, soft and nontender. No organomegaly or masses felt. Normal bowel sounds heard. Central nervous system: Alert and oriented. No focal neurological deficits. Extremities: No edema, no clubbing ,no cyanosis Skin: No rashes, lesions or ulcers,no icterus ,no pallor    Data Reviewed: I have personally reviewed following labs and imaging studies  CBC: Recent Labs  Lab 02/11/20 0549 02/12/20 0327 02/13/20 0451 02/14/20 0553 02/15/20 0510  WBC 13.5* 18.6* 16.8* 15.8* 17.1*  HGB 11.4* 11.2* 10.8* 10.1* 10.6*  HCT 32.6* 33.8* 32.6* 30.8* 31.8*  MCV 87.4 89.2 90.1 89.5 89.8  PLT 367 407* 362 331 333   Basic Metabolic Panel: Recent Labs  Lab 02/11/20 0549 02/12/20 0327 02/13/20 0451 02/14/20 0553 02/15/20 0510  NA 129*  131* 128* 130* 130*  K 4.7 4.6 4.3 3.8 4.0  CL 97* 94* 92* 95* 92*  CO2 24 30 31 29 31   GLUCOSE 121* 126* 90 96 92  BUN 8 14 15 13 9   CREATININE 0.51* 0.66 0.66 0.48* 0.56*  CALCIUM 8.5* 8.6* 8.1* 7.9* 8.1*  MG  --  2.1 2.0 1.9 2.0  PHOS  --  2.8 2.4* 2.5 2.6   GFR: Estimated Creatinine Clearance: 93 mL/min (A) (by C-G formula based on SCr of 0.56 mg/dL (L)). Liver Function Tests: Recent Labs  Lab 02/11/20 0549  AST 18  ALT 19  ALKPHOS 47  BILITOT 0.8  PROT 5.8*  ALBUMIN 2.5*   No results for input(s): LIPASE, AMYLASE in the last 168 hours. No results for input(s): AMMONIA in  the last 168 hours. Coagulation Profile: No results for input(s): INR, PROTIME in the last 168 hours. Cardiac Enzymes: No results for input(s): CKTOTAL, CKMB, CKMBINDEX, TROPONINI in the last 168 hours. BNP (last 3 results) No results for input(s): PROBNP in the last 8760 hours. HbA1C: No results for input(s): HGBA1C in the last 72 hours. CBG: No results for input(s): GLUCAP in the last 168 hours. Lipid Profile: No results for input(s): CHOL, HDL, LDLCALC, TRIG, CHOLHDL, LDLDIRECT in the last 72 hours. Thyroid Function Tests: No results for input(s): TSH, T4TOTAL, FREET4, T3FREE, THYROIDAB in the last 72 hours. Anemia Panel: No results for input(s): VITAMINB12, FOLATE, FERRITIN, TIBC, IRON, RETICCTPCT in the last 72 hours. Sepsis Labs: Recent Labs  Lab 02/10/20 1901 02/15/20 0510  PROCALCITON 0.12 <0.10    Recent Results (from the past 240 hour(s))  Culture, blood (Routine X 2) w Reflex to ID Panel     Status: None   Collection Time: 02/10/20  1:13 PM   Specimen: BLOOD  Result Value Ref Range Status   Specimen Description BLOOD RIGHT ANTECUBITAL  Final   Special Requests   Final    BOTTLES DRAWN AEROBIC AND ANAEROBIC Blood Culture results may not be optimal due to an inadequate volume of blood received in culture bottles   Culture   Final    NO GROWTH 5 DAYS Performed at [facility], [address] [place], [place] [address]    Report Status 02/15/2020 FINAL  Final  Culture, blood (Routine X 2) w Reflex to ID Panel     Status: None   Collection Time: 02/10/20  1:13 PM   Specimen: BLOOD  Result Value Ref Range Status   Specimen Description BLOOD LEFT ANTECUBITAL  Final   Special Requests   Final    BOTTLES DRAWN AEROBIC AND ANAEROBIC Blood Culture adequate volume   Culture   Final    NO GROWTH 5 DAYS Performed at [facility], [address] [place], [place] [address]    Report Status 02/15/2020 FINAL  Final  Resp Panel by RT-PCR (Flu A&B, Covid) Nasopharyngeal Swab     Status: None   Collection Time: 02/10/20  3:19 PM   Specimen: Nasopharyngeal Swab; Nasopharyngeal(NP) swabs in vial transport medium  Result Value Ref Range Status   SARS Coronavirus 2 by RT PCR NEGATIVE NEGATIVE Final    Comment: (NOTE) SARS-CoV-2 target nucleic acids are NOT DETECTED.  The SARS-CoV-2 RNA is generally detectable in upper respiratory specimens during the acute phase of infection. The lowest concentration of SARS-CoV-2 viral copies this assay can detect is 138 copies/mL. A negative result does not preclude SARS-Cov-2 infection and should not be used as the sole basis for treatment or other patient management decisions. A negative result may occur with  improper specimen collection/handling, submission of specimen other than nasopharyngeal swab, presence of viral mutation(s) within the areas targeted by this assay, and inadequate number of viral copies(<138 copies/mL). A negative result must be combined with clinical observations, patient history, and epidemiological information. The expected result is Negative.  Fact Sheet for Patients:  BloggerCourse.comhttps://www.fda.gov/media/152166/download  Fact Sheet for Healthcare Providers:  SeriousBroker.ithttps://www.fda.gov/media/152162/download  This test is no t yet approved or cleared by the [place] FDA and  has been  authorized for detection and/or diagnosis of SARS-CoV-2 by FDA under an Emergency Use Authorization (EUA). This EUA will remain  in effect (meaning this test can be used) for the duration of the COVID-19 declaration under Section 564(b)(1) of the  Act, 21 U.S.C.section 360bbb-3(b)(1), unless the authorization is terminated  or revoked sooner.       Influenza A by PCR NEGATIVE NEGATIVE Final   Influenza B by PCR NEGATIVE NEGATIVE Final    Comment: (NOTE) The Xpert Xpress SARS-CoV-2/FLU/RSV plus assay is intended as an aid in the diagnosis of influenza from Nasopharyngeal swab specimens and should not be used as a sole basis for treatment. Nasal washings and aspirates are unacceptable for Xpert Xpress SARS-CoV-2/FLU/RSV testing.  Fact Sheet for Patients: BloggerCourse.com  Fact Sheet for Healthcare Providers: SeriousBroker.it  This test is not yet approved or cleared by the [place] FDA and has been authorized for detection and/or diagnosis of SARS-CoV-2 by FDA under an Emergency Use Authorization (EUA). This EUA will remain in effect (meaning this test can be used) for the duration of the COVID-19 declaration under Section 564(b)(1) of the Act, 21 U.S.C. section 360bbb-3(b)(1), unless the authorization is terminated or revoked.  Performed at [facility], [address] [place], [place] [address]   Urine Culture     Status: Abnormal   Collection Time: 02/10/20  4:08 PM   Specimen: Urine, Random  Result Value Ref Range Status   Specimen Description   Final    URINE, RANDOM Performed at [facility], [address], [place], [place] [address]    Special Requests   Final    NONE Performed at [facility], [address] [place], [place] [address]    Culture (A)  Final    <10,000 COLONIES/mL INSIGNIFICANT GROWTH Performed at [facility], [address] [place], [place] [address]     Report Status 02/11/2020 FINAL  Final  Expectorated sputum assessment w rflx to resp cult     Status: None   Collection Time: 02/12/20  2:15 PM   Specimen: Expectorated Sputum  Result Value Ref Range Status   Specimen Description EXPECTORATED SPUTUM  Final   Special Requests NONE  Final   Sputum evaluation   Final    Sputum specimen not acceptable for testing.  Please recollect.   C/BONNYE ROSE AT 1600 02/12/20.PMF Performed at [facility], [address] [place], [place] [address]    Report Status 02/12/2020 FINAL  Final  Expectorated sputum assessment w rflx to resp cult     Status: None   Collection Time: 02/12/20  6:02 PM  Result Value Ref Range Status   Specimen Description EXPECTORATED SPUTUM  Final   Special Requests NONE  Final   Sputum evaluation   Final    THIS SPECIMEN IS ACCEPTABLE FOR SPUTUM CULTURE Performed at [facility], [address] [place], [place] [address]    Report Status 02/12/2020 FINAL  Final  Culture, respiratory     Status: None (Preliminary result)   Collection Time: 02/12/20  6:02 PM  Result Value Ref Range Status   Specimen Description   Final    EXPECTORATED SPUTUM Performed at [facility], [address] [place], [place] [address]    Special Requests   Final    NONE Reflexed from [id number] Performed at [facility], [address] [place], [place] [address]    Gram Stain   Final    FEW WBC PRESENT, PREDOMINANTLY PMN RARE GRAM POSITIVE COCCI IN CLUSTERS    Culture   Final    CULTURE REINCUBATED FOR BETTER GROWTH Performed at [facility], [address], [place], [place] [address]    Report Status PENDING  Incomplete  Resp Panel by RT-PCR (Flu A&B, Covid) Nasopharyngeal Swab     Status: None   Collection Time: 02/15/20  6:37 AM   Specimen: Nasopharyngeal Swab; Nasopharyngeal(NP) swabs in vial transport medium  Result Value Ref Range Status   SARS Coronavirus 2 by RT PCR NEGATIVE  NEGATIVE Final    Comment: (NOTE) SARS-CoV-2 target nucleic acids are NOT DETECTED.  The SARS-CoV-2 RNA is generally detectable in upper respiratory specimens during the acute phase of infection. The lowest concentration of SARS-CoV-2 viral copies this assay can detect is 138 copies/mL. A negative result does not preclude SARS-Cov-2 infection and should not be used as the sole basis for treatment or other patient management decisions. A negative result may occur with  improper specimen collection/handling, submission of specimen other than nasopharyngeal swab, presence of viral mutation(s) within the areas targeted by this assay, and inadequate number of viral copies(<138 copies/mL). A negative result must be combined with clinical observations, patient history, and epidemiological information. The expected result is Negative.  Fact Sheet for Patients:  BloggerCourse.com  Fact Sheet for Healthcare Providers:  SeriousBroker.it  This test is no t yet approved or cleared by the [place] FDA and  has been authorized for detection and/or diagnosis of SARS-CoV-2 by FDA under an Emergency Use Authorization (EUA). This EUA will remain  in effect (meaning this test can be used) for the duration of the COVID-19 declaration under Section 564(b)(1) of the Act, 21 U.S.C.section 360bbb-3(b)(1), unless the authorization is terminated  or revoked sooner.       Influenza A by PCR NEGATIVE NEGATIVE Final   Influenza B by PCR NEGATIVE NEGATIVE Final    Comment: (NOTE) The Xpert Xpress SARS-CoV-2/FLU/RSV plus assay is intended as an aid in the diagnosis of influenza from Nasopharyngeal swab specimens and should not be used as a sole basis for treatment. Nasal washings and aspirates are unacceptable for Xpert Xpress SARS-CoV-2/FLU/RSV testing.  Fact Sheet for Patients: BloggerCourse.com  Fact Sheet for Healthcare  Providers: SeriousBroker.it  This test is not yet approved or cleared by the [place] FDA and has been authorized for detection and/or diagnosis of SARS-CoV-2 by FDA under an Emergency Use Authorization (EUA). This EUA will remain in effect (meaning this test can be used) for the duration of the COVID-19 declaration under Section 564(b)(1) of the Act, 21 U.S.C. section 360bbb-3(b)(1), unless the authorization is terminated or revoked.  Performed at [facility], [address], [place], [place] [address]          Radiology Studies: DG Chest 1 View  Result Date: 02/15/2020 CLINICAL DATA:  64 year old male with shortness of breath. On home oxygen. Interstitial lung disease. EXAM: CHEST  1 VIEW COMPARISON:  Chest radiographs 02/10/2020 and earlier. FINDINGS: Portable AP upright view at 0605 hours. Lower lung volumes with increasing confluent bibasilar pulmonary opacity and new right side pleural effusion visible along the right lung periphery and in the apex. Possible small left apical pleural fluid also. Stable cardiac size and mediastinal contours. No pneumothorax. Visualized tracheal air column is within normal limits. No acute osseous abnormality identified. Negative visible bowel gas pattern. IMPRESSION: Chronic interstitial lung disease with progressive bibasilar pneumonia suspected and increasing right pleural effusion. Electronically Signed   By: Odessa Fleming M.D.   On: 02/15/2020 06:31        Scheduled Meds: . ALPRAZolam  0.5 mg Oral TID  . enoxaparin (LOVENOX) injection  40 mg Subcutaneous Q24H  . escitalopram  20 mg Oral  Daily  . feeding supplement  237 mL Oral BID BM  . fluticasone furoate-vilanterol  1 puff Inhalation Daily   And  . umeclidinium bromide  1 puff Inhalation Daily  . furosemide  20 mg Intravenous BID  . methylPREDNISolone (SOLU-MEDROL) injection  60 mg Intravenous Once  . methylPREDNISolone (SOLU-MEDROL) injection  40  mg Intravenous Q24H  . metoprolol tartrate  25 mg Oral BID  . sodium chloride flush  3 mL Intravenous Q12H   Continuous Infusions: . ceFEPime (MAXIPIME) IV    . vancomycin    . vancomycin       LOS: 5 days    Time spent: 35 mins.More than 50% of that time was spent in counseling and/or coordination of care.      Burnadette Pop, MD [facility] P12/29/2021, 8:14 AM

## 2020-02-15 NOTE — Consult Note (Signed)
[facility] Face-to-Face Psychiatry Consult   Reason for Consult: Consult for 64 year old man with interstitial lung disease with a concern about anxiety Referring Physician:  Adhikari Patient Identification: Austin Kim MRN:  [id number] Principal Diagnosis: Adjustment disorder with anxiety Diagnosis:  Principal Problem:   Adjustment disorder with anxiety Active Problems:   ILD (interstitial lung disease) ([place])   Hypertension   Hyponatremia   COPD with acute exacerbation ([place])   Acute hypoxemic respiratory failure ([place])   Total Time spent with patient: 1 hour  Subjective:   Austin Kim is a 64 y.o. male patient admitted with "my breathing is gotten worse".  HPI: Patient seen chart reviewed.  64 year old man admitted to the hospital with shortness of breath with COPD and interstitial lung disease.  He is currently in the ICU with a plan to transfer him to [facility] when possible.  Concern was expressed about anxiety.  Patient states that he is always a nervous and anxious person at baseline.  He had previously been taking a low dose of Valium at night chronically.  He had stopped that recently before coming into the hospital.  Now that he is here he reports that there are times when he "forgets how to breathe" and has trouble breathing because he gets nervous and panicky.  He is being prescribed Xanax 1/2 mg 3 times a day which is the functional equivalent of quite a bit more than his stated prior Valium dose and finds that the Xanax is helpful but still leaves him with spells of feeling anxious.  Not reporting any suicidal thought or homicidal thought no evidence of psychosis.  Past Psychiatric History: No past psychiatric inpatient treatment or suicide attempts.  Risk to Self:   Risk to Others:   Prior Inpatient Therapy:   Prior Outpatient Therapy:    Past Medical History:  Past Medical History:  Diagnosis Date  . Blindness of right eye   . COPD (chronic obstructive pulmonary  disease) ([place])   . Elevated lipids   . Hx of basal cell carcinoma 02/05/2010   L temple  . Hx of dysplastic nevus 04/12/2019   R spinal mid back, moderate to severe atypia, excised 05/09/2019  . Hypertension   . Spontaneous pneumothorax   . Squamous cell carcinoma of skin 04/12/2019   Posterior Crown, SCC IS    Past Surgical History:  Procedure Laterality Date  . BASAL CELL CARCINOMA EXCISION Bilateral   . COLONOSCOPY WITH PROPOFOL N/A 07/28/2017   Procedure: COLONOSCOPY WITH PROPOFOL;  Surgeon: Lollie Sails, MD;  Location: [facility] ENDOSCOPY;  Service: Endoscopy;  Laterality: N/A;  . EYE SURGERY    . PLEURAL SCARIFICATION     Family History:  Family History  Problem Relation Age of Onset  . Diabetes Father   . CAD Father   . Colon cancer Father   . Heart disease Father        Heart transplant  . Diabetes Brother        Type 1 DM   Family Psychiatric  History: See previous Social History:  Social History   Substance and Sexual Activity  Alcohol Use Yes     Social History   Substance and Sexual Activity  Drug Use No    Social History   Socioeconomic History  . Marital status: Married    Spouse name: Not on file  . Number of children: Not on file  . Years of education: Not on file  . Highest education level: Not on  file  Occupational History  . Not on file  Tobacco Use  . Smoking status: Former Smoker    Quit date: 2001    Years since quitting: 21.0  . Smokeless tobacco: Never Used  . Tobacco comment: 2 PPD for ~20 years  Vaping Use  . Vaping Use: Never used  Substance and Sexual Activity  . Alcohol use: Yes  . Drug use: No  . Sexual activity: Not on file  Other Topics Concern  . Not on file  Social History Narrative  . Not on file   Social Determinants of Health   Financial Resource Strain: Not on file  Food Insecurity: Not on file  Transportation Needs: Not on file  Physical Activity: Not on file  Stress: Not on file  Social Connections:  Not on file   Additional Social History:    Allergies:  No Known Allergies  Labs:  Results for orders placed or performed during the hospital encounter of 02/10/20 (from the past 48 hour(s))  CBC     Status: Abnormal   Collection Time: 02/14/20  5:53 AM  Result Value Ref Range   WBC 15.8 (H) 4.0 - 10.5 K/uL   RBC 3.44 (L) 4.22 - 5.81 MIL/uL   Hemoglobin 10.1 (L) 13.0 - 17.0 g/dL   HCT 30.8 (L) 39.0 - 52.0 %   MCV 89.5 80.0 - 100.0 fL   MCH 29.4 26.0 - 34.0 pg   MCHC 32.8 30.0 - 36.0 g/dL   RDW 13.9 11.5 - 15.5 %   Platelets 331 150 - 400 K/uL   nRBC 0.0 0.0 - 0.2 %    Comment: Performed at [facility], [address] [place], [place] [address]  Magnesium     Status: None   Collection Time: 02/14/20  5:53 AM  Result Value Ref Range   Magnesium 1.9 1.7 - 2.4 mg/dL    Comment: Performed at [facility], [address] [place], [place] [address]  Phosphorus     Status: None   Collection Time: 02/14/20  5:53 AM  Result Value Ref Range   Phosphorus 2.5 2.5 - 4.6 mg/dL    Comment: Performed at [facility], [address] [place], [place] [id number]  Basic metabolic panel     Status: Abnormal   Collection Time: 02/14/20  5:53 AM  Result Value Ref Range   Sodium 130 (L) 135 - 145 mmol/L   Potassium 3.8 3.5 - 5.1 mmol/L   Chloride 95 (L) 98 - 111 mmol/L   CO2 29 22 - 32 mmol/L   Glucose, Bld 96 70 - 99 mg/dL    Comment: Glucose reference range applies only to samples taken after fasting for at least 8 hours.   BUN 13 8 - 23 mg/dL   Creatinine, Ser 0.48 (L) 0.61 - 1.24 mg/dL   Calcium 7.9 (L) 8.9 - 10.3 mg/dL   GFR, Estimated >60 >60 mL/min    Comment: (NOTE) Calculated using the CKD-EPI Creatinine Equation (2021)    Anion gap 6 5 - 15    Comment: Performed at [facility], [address] [place], [place] [address]  CBC     Status: Abnormal   Collection Time: 02/15/20  5:10 AM  Result Value Ref Range   WBC 17.1 (H) 4.0 - 10.5  K/uL   RBC 3.54 (L) 4.22 - 5.81 MIL/uL   Hemoglobin 10.6 (L) 13.0 - 17.0 g/dL   HCT 31.8 (L) 39.0 - 52.0 %   MCV  89.8 80.0 - 100.0 fL   MCH 29.9 26.0 - 34.0 pg   MCHC 33.3 30.0 - 36.0 g/dL   RDW 14.0 11.5 - 15.5 %   Platelets 333 150 - 400 K/uL   nRBC 0.0 0.0 - 0.2 %    Comment: Performed at [facility], [address] [place], [place] [address]  Magnesium     Status: None   Collection Time: 02/15/20  5:10 AM  Result Value Ref Range   Magnesium 2.0 1.7 - 2.4 mg/dL    Comment: Performed at [facility], [address] [place], [place] [address]  Phosphorus     Status: None   Collection Time: 02/15/20  5:10 AM  Result Value Ref Range   Phosphorus 2.6 2.5 - 4.6 mg/dL    Comment: Performed at [facility], [address] [place], [place] [id number]  Basic metabolic panel     Status: Abnormal   Collection Time: 02/15/20  5:10 AM  Result Value Ref Range   Sodium 130 (L) 135 - 145 mmol/L   Potassium 4.0 3.5 - 5.1 mmol/L   Chloride 92 (L) 98 - 111 mmol/L   CO2 31 22 - 32 mmol/L   Glucose, Bld 92 70 - 99 mg/dL    Comment: Glucose reference range applies only to samples taken after fasting for at least 8 hours.   BUN 9 8 - 23 mg/dL   Creatinine, Ser 0.56 (L) 0.61 - 1.24 mg/dL   Calcium 8.1 (L) 8.9 - 10.3 mg/dL   GFR, Estimated >60 >60 mL/min    Comment: (NOTE) Calculated using the CKD-EPI Creatinine Equation (2021)    Anion gap 7 5 - 15    Comment: Performed at [facility], [address] [place], [place] [address]  Procalcitonin - Baseline     Status: None   Collection Time: 02/15/20  5:10 AM  Result Value Ref Range   Procalcitonin <0.10 ng/mL    Comment:        Interpretation: PCT (Procalcitonin) <= 0.5 ng/mL: Systemic infection (sepsis) is not likely. Local bacterial infection is possible. (NOTE)       Sepsis PCT Algorithm           Lower Respiratory Tract                                      Infection PCT Algorithm     ----------------------------     ----------------------------         PCT < 0.25 ng/mL                PCT < 0.10 ng/mL          Strongly encourage             Strongly discourage   discontinuation of antibiotics    initiation of antibiotics    ----------------------------     -----------------------------       PCT 0.25 - 0.50 ng/mL            PCT 0.10 - 0.25 ng/mL               OR       >80% decrease in PCT            Discourage initiation of  antibiotics      Encourage discontinuation           of antibiotics    ----------------------------     -----------------------------         PCT >= 0.50 ng/mL              PCT 0.26 - 0.50 ng/mL               AND        <80% decrease in PCT             Encourage initiation of                                             antibiotics       Encourage continuation           of antibiotics    ----------------------------     -----------------------------        PCT >= 0.50 ng/mL                  PCT > 0.50 ng/mL               AND         increase in PCT                  Strongly encourage                                      initiation of antibiotics    Strongly encourage escalation           of antibiotics                                     -----------------------------                                           PCT <= 0.25 ng/mL                                                 OR                                        > 80% decrease in PCT                                      Discontinue / Do not initiate                                             antibiotics  Performed at [facility], [address] [place], [place] [address]   Brain natriuretic peptide     Status: Abnormal   Collection Time:  02/15/20  5:10 AM  Result Value Ref Range   B Natriuretic Peptide 242.3 (H) 0.0 - 100.0 pg/mL    Comment: Performed at [facility], [address] [place], [place] [address]  Resp  Panel by RT-PCR (Flu A&B, Covid) Nasopharyngeal Swab     Status: None   Collection Time: 02/15/20  6:37 AM   Specimen: Nasopharyngeal Swab; Nasopharyngeal(NP) swabs in vial transport medium  Result Value Ref Range   SARS Coronavirus 2 by RT PCR NEGATIVE NEGATIVE    Comment: (NOTE) SARS-CoV-2 target nucleic acids are NOT DETECTED.  The SARS-CoV-2 RNA is generally detectable in upper respiratory specimens during the acute phase of infection. The lowest concentration of SARS-CoV-2 viral copies this assay can detect is 138 copies/mL. A negative result does not preclude SARS-Cov-2 infection and should not be used as the sole basis for treatment or other patient management decisions. A negative result may occur with  improper specimen collection/handling, submission of specimen other than nasopharyngeal swab, presence of viral mutation(s) within the areas targeted by this assay, and inadequate number of viral copies(<138 copies/mL). A negative result must be combined with clinical observations, patient history, and epidemiological information. The expected result is Negative.  Fact Sheet for Patients:  EntrepreneurPulse.com.au  Fact Sheet for Healthcare Providers:  IncredibleEmployment.be  This test is no t yet approved or cleared by the [place] FDA and  has been authorized for detection and/or diagnosis of SARS-CoV-2 by FDA under an Emergency Use Authorization (EUA). This EUA will remain  in effect (meaning this test can be used) for the duration of the COVID-19 declaration under Section 564(b)(1) of the Act, 21 U.S.C.section 360bbb-3(b)(1), unless the authorization is terminated  or revoked sooner.       Influenza A by PCR NEGATIVE NEGATIVE   Influenza B by PCR NEGATIVE NEGATIVE    Comment: (NOTE) The Xpert Xpress SARS-CoV-2/FLU/RSV plus assay is intended as an aid in the diagnosis of influenza from Nasopharyngeal swab specimens  and should not be used as a sole basis for treatment. Nasal washings and aspirates are unacceptable for Xpert Xpress SARS-CoV-2/FLU/RSV testing.  Fact Sheet for Patients: EntrepreneurPulse.com.au  Fact Sheet for Healthcare Providers: IncredibleEmployment.be  This test is not yet approved or cleared by the [place] FDA and has been authorized for detection and/or diagnosis of SARS-CoV-2 by FDA under an Emergency Use Authorization (EUA). This EUA will remain in effect (meaning this test can be used) for the duration of the COVID-19 declaration under Section 564(b)(1) of the Act, 21 U.S.C. section 360bbb-3(b)(1), unless the authorization is terminated or revoked.  Performed at [facility], [address] [place], [place] [address]   MRSA PCR Screening     Status: None   Collection Time: 02/15/20  9:52 AM   Specimen: Nasal Mucosa; Nasopharyngeal  Result Value Ref Range   MRSA by PCR NEGATIVE NEGATIVE    Comment:        The GeneXpert MRSA Assay (FDA approved for NASAL specimens only), is one component of a comprehensive MRSA colonization surveillance program. It is not intended to diagnose MRSA infection nor to guide or monitor treatment for MRSA infections. Performed at [facility], [address] [place], [place] [address]   Blood gas, arterial     Status: Abnormal   Collection Time: 02/15/20  2:28 PM  Result Value Ref Range   FIO2 1.00    O2 Content NON-REBREATHER OXYGEN MASK L/min   pH, Arterial 7.46 (H) 7.350 - 7.450  pCO2 arterial 51 (H) 32.0 - 48.0 mmHg   pO2, Arterial 78 (L) 83.0 - 108.0 mmHg   Bicarbonate 36.3 (H) 20.0 - 28.0 mmol/L   Acid-Base Excess 10.9 (H) 0.0 - 2.0 mmol/L   O2 Saturation 96.1 %   Patient temperature 37.0    Collection site RIGHT RADIAL    Sample type ARTERIAL DRAW    Allens test (pass/fail) PASS PASS    Comment: Performed at [facility], [address]  [place], [place] [address]  Glucose, capillary     Status: Abnormal   Collection Time: 02/15/20  3:58 PM  Result Value Ref Range   Glucose-Capillary 173 (H) 70 - 99 mg/dL    Comment: Glucose reference range applies only to samples taken after fasting for at least 8 hours.    Current Facility-Administered Medications  Medication Dose Route Frequency Provider Last Rate Last Admin  . ALPRAZolam (XANAX) tablet 0.5 mg  0.5 mg Oral Q6H PRN Juventino Pavone T, MD      . ceFEPIme (MAXIPIME) 2 g in sodium chloride 0.9 % 100 mL IVPB  2 g Intravenous Q8H Virl Cagey E, RPH 200 mL/hr at 02/15/20 1431 2 g at 02/15/20 1431  . Chlorhexidine Gluconate Cloth 2 % PADS 6 each  6 each Topical Daily Shelly Coss, MD   6 each at 02/15/20 1727  . clonazepam (KLONOPIN) disintegrating tablet 0.5 mg  0.5 mg Oral TID Leanora Murin T, MD      . enoxaparin (LOVENOX) injection 40 mg  40 mg Subcutaneous Q24H Clarnce Flock, MD   40 mg at 02/14/20 2212  . escitalopram (LEXAPRO) tablet 20 mg  20 mg Oral Daily Clarnce Flock, MD   20 mg at 02/15/20 0754  . feeding supplement (ENSURE ENLIVE / ENSURE PLUS) liquid 237 mL  237 mL Oral BID BM Shawna Clamp, MD   237 mL at 02/15/20 1428  . fluticasone furoate-vilanterol (BREO ELLIPTA) 100-25 MCG/INH 1 puff  1 puff Inhalation Daily Clarnce Flock, MD   1 puff at 02/15/20 0744   And  . umeclidinium bromide (INCRUSE ELLIPTA) 62.5 MCG/INH 1 puff  1 puff Inhalation Daily Clarnce Flock, MD   1 puff at 02/15/20 0744  . furosemide (LASIX) injection 20 mg  20 mg Intravenous BID Ottie Glazier, MD   20 mg at 02/15/20 0947  . guaiFENesin-dextromethorphan (ROBITUSSIN DM) 100-10 MG/5ML syrup 5 mL  5 mL Oral Q4H PRN Lang Snow, NP   5 mL at 02/15/20 1135  . ipratropium-albuterol (DUONEB) 0.5-2.5 (3) MG/3ML nebulizer solution 3 mL  3 mL Nebulization Q6H PRN Lang Snow, NP      . methylPREDNISolone sodium succinate (SOLU-MEDROL) 125 mg/2 mL injection 60 mg   60 mg Intravenous Q6H Adhikari, Amrit, MD   60 mg at 02/15/20 1425  . metoprolol tartrate (LOPRESSOR) tablet 25 mg  25 mg Oral BID Ottie Glazier, MD      . morphine 2 MG/ML injection 1 mg  1 mg Intravenous Q3H PRN Ottie Glazier, MD   1 mg at 02/14/20 2212  . ondansetron (ZOFRAN) tablet 4 mg  4 mg Oral Q6H PRN Clarnce Flock, MD       Or  . ondansetron [facility]) injection 4 mg  4 mg Intravenous Q6H PRN Clarnce Flock, MD      . sodium chloride flush (NS) 0.9 % injection 3 mL  3 mL Intravenous Q12H Clarnce Flock, MD   3 mL at 02/15/20 0946  .  traZODone (DESYREL) tablet 50 mg  50 mg Oral QHS PRN Venora Maples, MD   50 mg at 02/14/20 2228  . vancomycin (VANCOCIN) IVPB 1000 mg/200 mL premix  1,000 mg Intravenous Q12H Sharen Hones, [facility] [place] [facility]        Musculoskeletal: Strength & Muscle Tone: decreased Gait & Station: unable to stand Patient leans: N/A  Psychiatric Specialty Exam: Physical Exam Vitals and nursing note reviewed.  Constitutional:      Appearance: He is well-developed and well-nourished.  HENT:     Head: Normocephalic and atraumatic.  Eyes:     Conjunctiva/sclera: Conjunctivae normal.     Pupils: Pupils are equal, round, and reactive to light.  Cardiovascular:     Heart sounds: Normal heart sounds.  Pulmonary:     Effort: Pulmonary effort is normal.  Abdominal:     Palpations: Abdomen is soft.  Musculoskeletal:        General: Normal range of motion.     Cervical back: Normal range of motion.  Skin:    General: Skin is warm and dry.  Neurological:     General: No focal deficit present.     Mental Status: He is alert.  Psychiatric:        Attention and Perception: Attention normal.        Mood and Affect: Mood is anxious.        Thought Content: Thought content does not include suicidal ideation.     Review of Systems  Constitutional: Negative.   HENT: Negative.   Eyes: Negative.   Respiratory: Positive for shortness of breath.    Cardiovascular: Negative.   Gastrointestinal: Negative.   Musculoskeletal: Negative.   Skin: Negative.   Neurological: Negative.   Psychiatric/Behavioral: The patient is nervous/anxious.     Blood pressure 97/74, pulse 89, temperature 98.2 F (36.8 C), temperature source Axillary, resp. rate 16, height 5\' 9"  (1.753 m), weight 69.4 kg, SpO2 95 %.Body mass index is 22.59 kg/m.  General Appearance: Casual  Eye Contact:  Good  Speech:  Clear and Coherent  Volume:  Normal  Mood:  Anxious  Affect:  Constricted  Thought Process:  Coherent  Orientation:  Full (Time, Place, and Person)  Thought Content:  Logical  Suicidal Thoughts:  No  Homicidal Thoughts:  No  Memory:  Immediate;   Fair Recent;   Fair Remote;   Fair  Judgement:  Fair  Insight:  Fair  Psychomotor Activity:  Normal  Concentration:  Concentration: Fair  Recall:  of Knowledge:  Fair  Language:  Fair  Akathisia:  No  Handed:  Right  AIMS (if indicated):     Assets:  Desire for Improvement  ADL's:  Intact  Cognition:  WNL  Sleep:        Treatment Plan Summary: Plan Patient with subjective complaints of anxiety.  Currently looks calm and lucid not panicky.  No evidence of suicidal thinking or dangerousness.  I talked with the patient and his wife about how I am a little hesitant to add more benzodiazepine particularly given that he had been anxious to get off of it before, but given the steroids and the worsening medical situation and the time in the ICU it does seem like he is suffering more from nervousness.  I propose it may work better to change Xanax for Klonopin half milligram 3 times a day rather than increasing the dose.  I am also adding a as needed of Xanax that can be taken as  needed for anxiety.  Supportive counseling and educational counseling completed.  Disposition: No evidence of imminent risk to self or others at present.   Patient does not meet criteria for psychiatric inpatient  admission. Supportive therapy provided about ongoing stressors.  Alethia Berthold, MD 02/15/2020 5:40 PM

## 2020-02-15 NOTE — Plan of Care (Signed)
NT informed me of pt's O2 sats.  Called respiratory to inform.  Pt continued to desat into low 80's and breathing was very labored.  Pt pt on HFNC, but he didn't recover so called a rapid response.  Also put non-rebreather on him and he slowly improved.  Gave 2 mg morphine for air hunger and NP inc dose of solumedrol.  Pt stated he was feeling less stressed.  NP also order a covid swab to r/o and he was put on precautions.  Pt has had 2 vaccinations.

## 2020-02-15 NOTE — Progress Notes (Addendum)
Pt being transferred to CCU15 per MD order, pt continues to be on NRB 15 L Osat 97%. Pt awaiting transfer to [facility]. facesheet was faxed to Transfer center. Report called to [employer]. Wife at bedside aware of transfer. Belonging sent with pt.

## 2020-02-15 NOTE — Consult Note (Signed)
Pharmacy Antibiotic Note  Austin Kim is a 64 y.o. male admitted on 02/10/2020 with worsening SOB .  Pharmacy has been consulted for Vancomycin and Cefepime dosing for HAP  Plan:  Initiate Cefepime 2 gram q8h  Give vancomycin 1500 mg x 1 loading dose  Vancomycin maintenance dose of 1000 mg q 12 h  Goal AUC 400-550. Expected AUC: 495,   SCr used: 0.8  Daily BMP while on vancomycin therapy  Levels at steady state if needed   Height: 5\' 9"  (175.3 cm) Weight: 68.7 kg (151 lb 7.3 oz) IBW/kg (Calculated) : 70.7  Temp (24hrs), Avg:98.3 F (36.8 C), Min:97.8 F (36.6 C), Max:98.9 F (37.2 C)  Recent Labs  Lab 02/11/20 0549 02/12/20 0327 02/13/20 0451 02/14/20 0553 02/15/20 0510  WBC 13.5* 18.6* 16.8* 15.8* 17.1*  CREATININE 0.51* 0.66 0.66 0.48* 0.56*    Estimated Creatinine Clearance: 93 mL/min (A) (by C-G formula based on SCr of 0.56 mg/dL (L)).    No Known Allergies  Antimicrobials this admission: 12/24 >> Azithromycin 500 mg daily 12/24  >> Bactrim SS BID (PCP prophylaxis)  Dose adjustments this admission: n/a  Microbiology results: 12/24 BCx: NG 12/26 Sputum: in process  12/29 MRSA PCR: pending  Thank you for allowing pharmacy to be a part of this patient's care.  1/30, PharmD, BCPS 02/15/2020 7:36 AM

## 2020-02-16 LAB — RESPIRATORY PANEL BY PCR

## 2020-02-19 LAB — BODY FLUID CULTURE: Culture: NO GROWTH

## 2020-02-20 LAB — CYTOLOGY - NON PAP

## 2020-02-21 LAB — COMP PANEL: LEUKEMIA/LYMPHOMA

## 2020-02-23 LAB — ACID FAST SMEAR (AFB, MYCOBACTERIA): Acid Fast Smear: NEGATIVE

## 2020-03-19 LAB — FUNGUS CULTURE WITH STAIN

## 2020-03-19 LAB — FUNGAL ORGANISM REFLEX

## 2020-03-19 LAB — FUNGUS CULTURE RESULT

## 2020-03-20 DEATH — deceased

## 2020-04-05 LAB — ACID FAST CULTURE WITH REFLEXED SENSITIVITIES (MYCOBACTERIA): Acid Fast Culture: NEGATIVE

## 2020-04-23 ENCOUNTER — Encounter: Payer: BLUE CROSS/BLUE SHIELD | Admitting: Dermatology

## 2022-01-17 LAB — PH, BODY FLUID

## 2022-04-10 IMAGING — DX DG CHEST 1V PORT
1 series · 1 of 1 positions shown · non-contrast
Comparison: 10/27/2019

CLINICAL DATA: Shortness of breath

EXAM:
PORTABLE CHEST 1 VIEW

[chest ap]
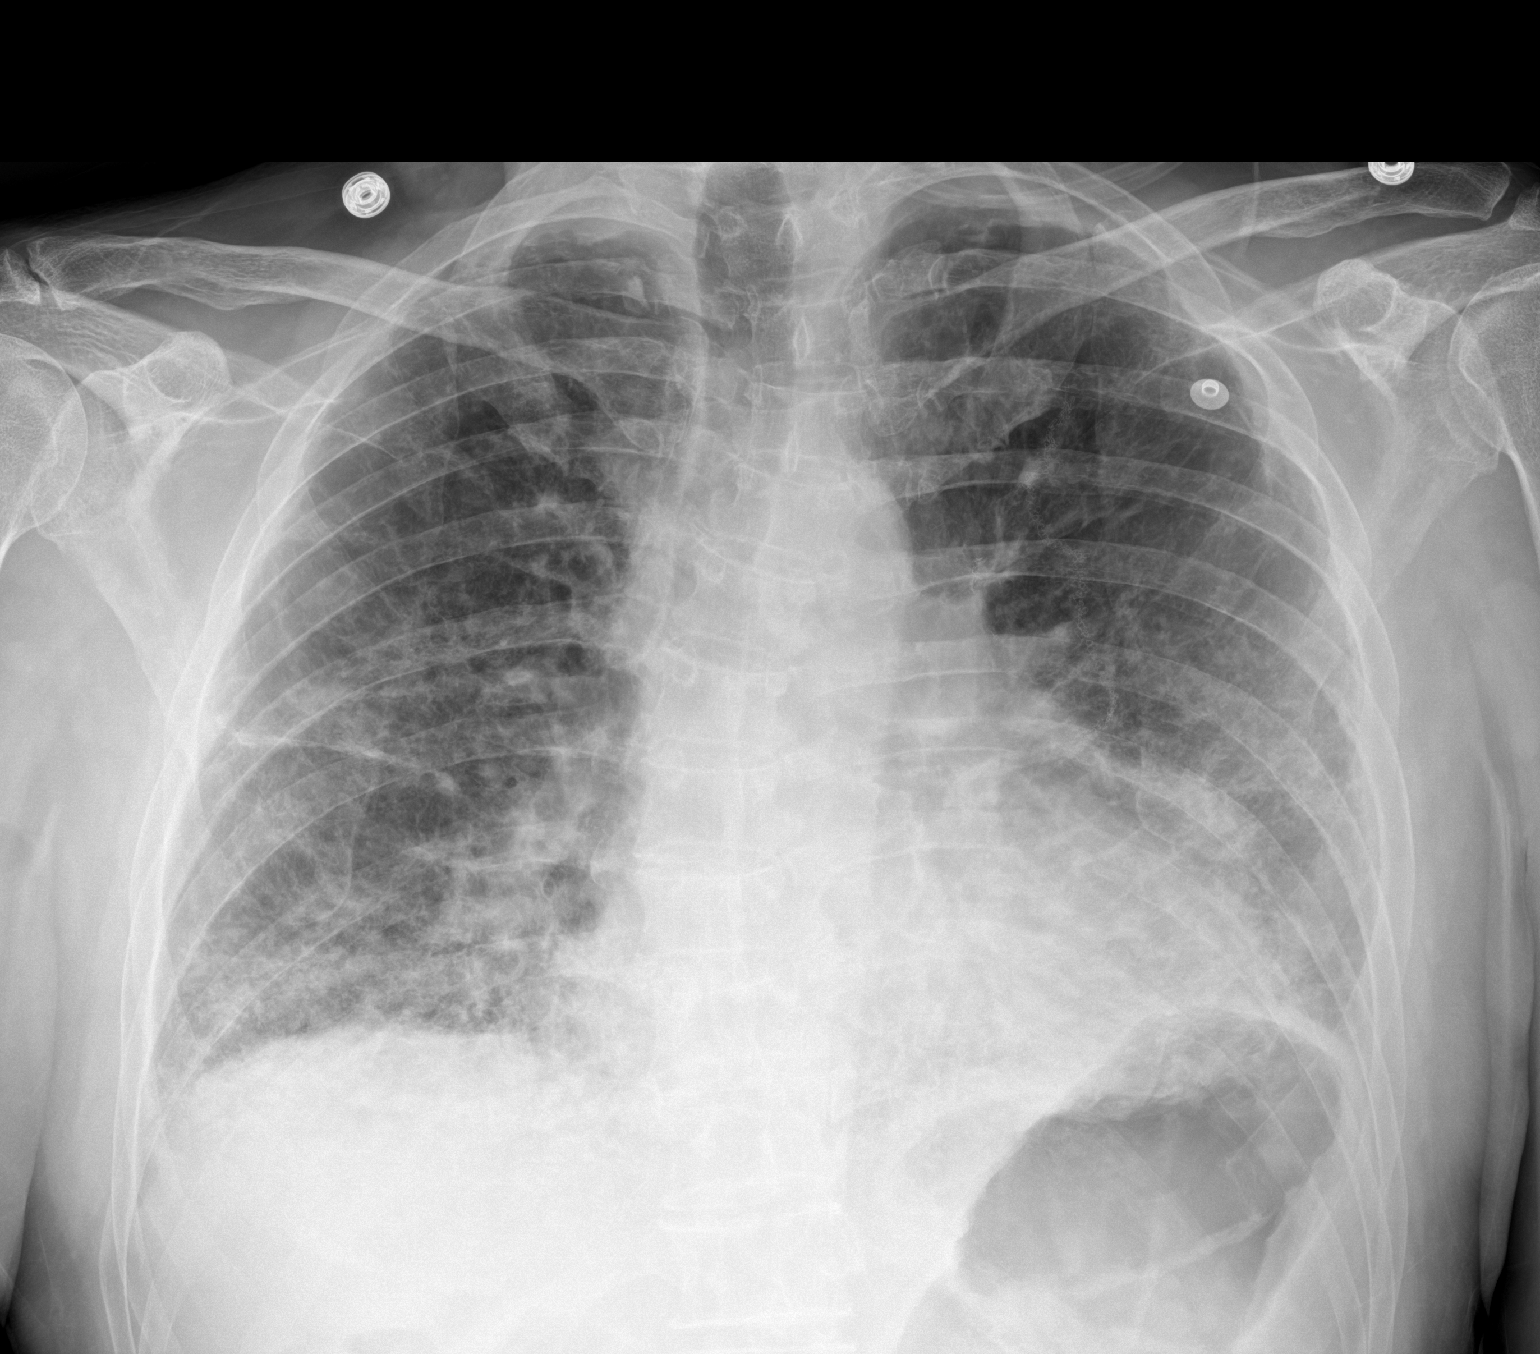

[1 of 1 positions shown; findings below may reference images not displayed]

FINDINGS: Diffuse interstitial prominence. Patchy bilateral airspace disease.
No real change since prior study. Heart is borderline in size. No
effusions or acute bony abnormality.
IMPRESSION: Diffuse interstitial prominence with coarsened bilateral lower lobe
airspace opacities, similar to prior study. This may reflect chronic
lung disease/fibrosis. It is difficult to completely exclude
superimposed acute process.

## 2022-07-15 IMAGING — CT CT CHEST W/ CM
2 of 3 series · 15 of 36 positions shown, 18 images · IV contrast (omnipaque)
Comparison: Chest CT dated 11/06/2019 and chest radiograph dated
02/15/2020.

CLINICAL DATA: Respiratory failure. History of COPD and
interstitial lung disease due to post COVID fibrosis.

EXAM:
CT CHEST WITH CONTRAST
TECHNIQUE: Multidetector CT imaging of the chest was performed during
intravenous contrast administration.
CONTRAST:  75mL OMNIPAQUE IOHEXOL 300 MG/ML  SOLN

[Series 2: axial st · axial · 0.73mm/px · z∈[-711,-447]mm · 12 of 156 slices shown, 15 images]
[im 12/156  mediastinal]
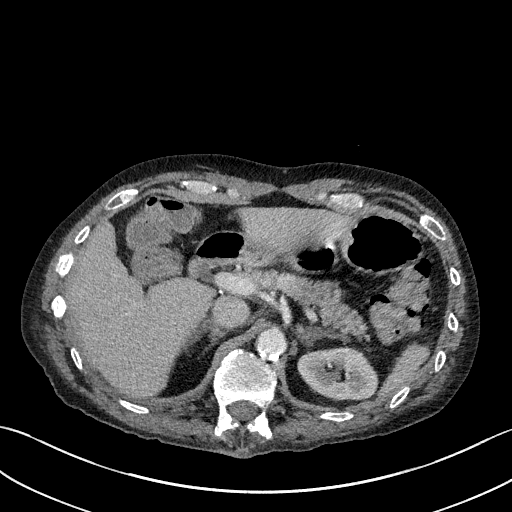
[im 12/156  lung]
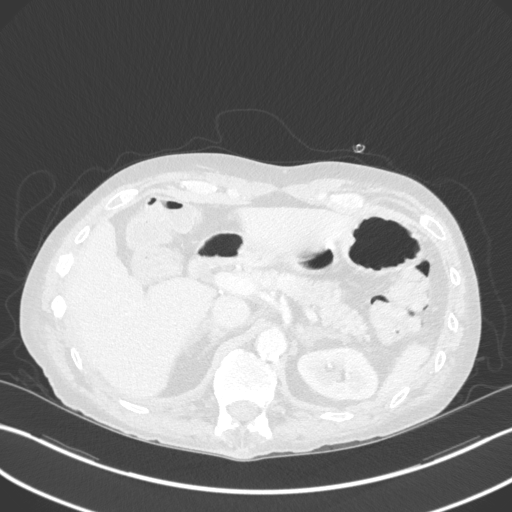
[im 23/156  lung]
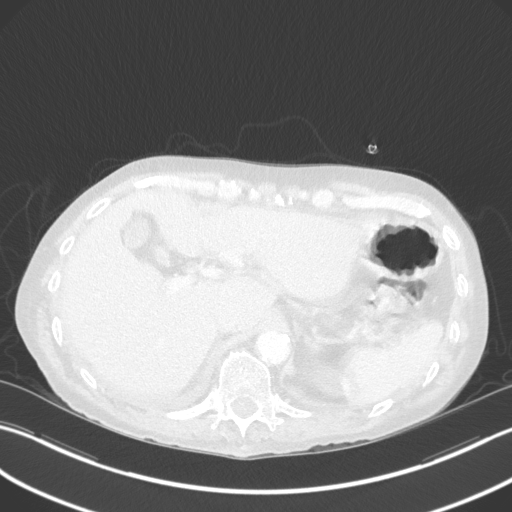
[im 35/156  lung]
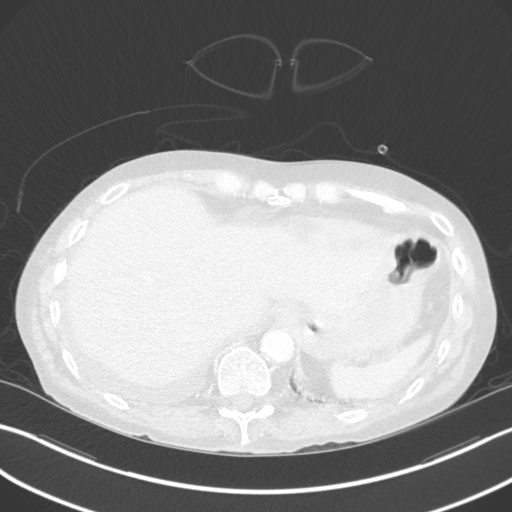
[im 46/156  lung]
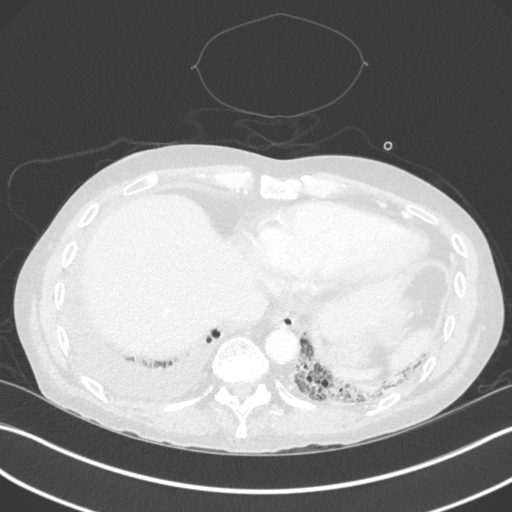
[im 58/156  mediastinal]
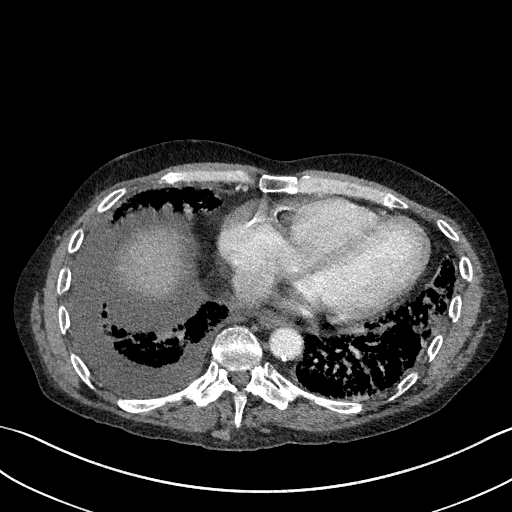
[im 58/156  lung]
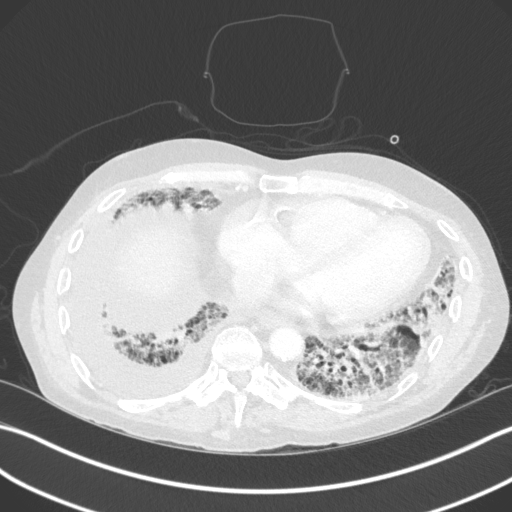
[im 69/156  lung]
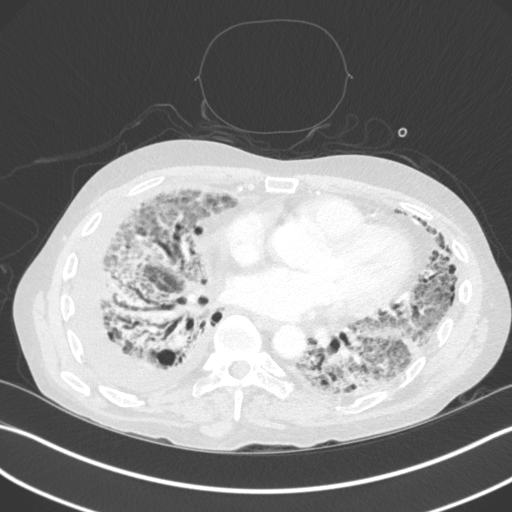
[im 87/156  lung]
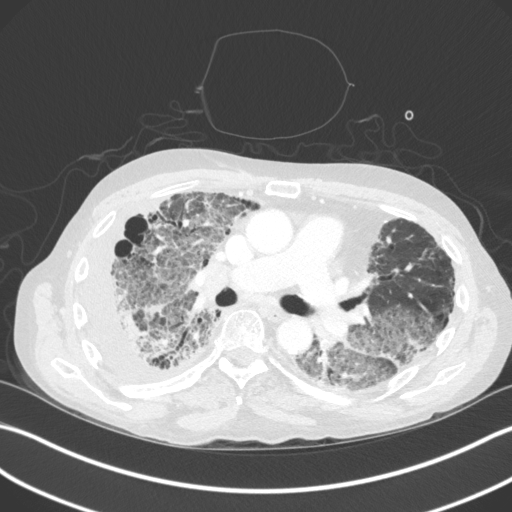
[im 98/156  lung]
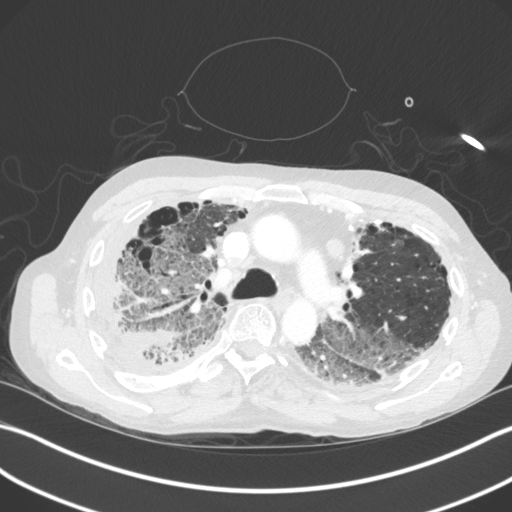
[im 110/156  mediastinal]
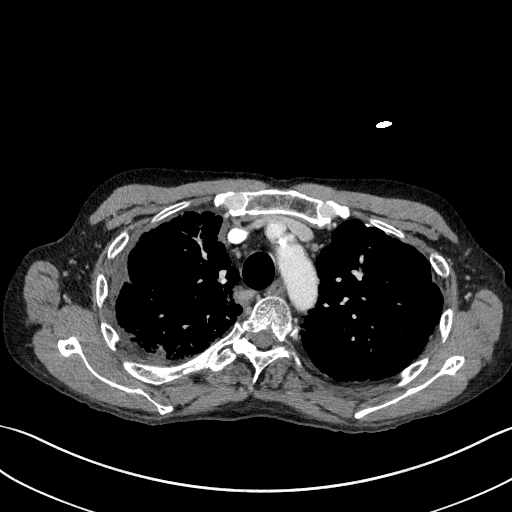
[im 110/156  lung]
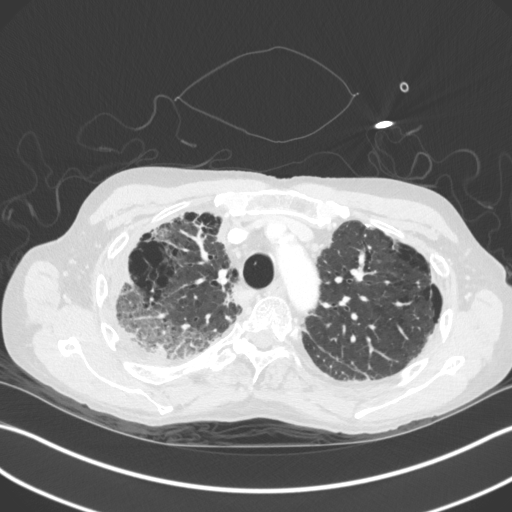
[im 121/156  lung]
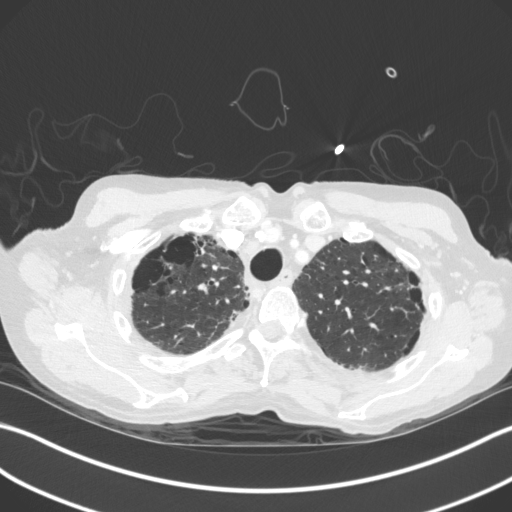
[im 133/156  lung]
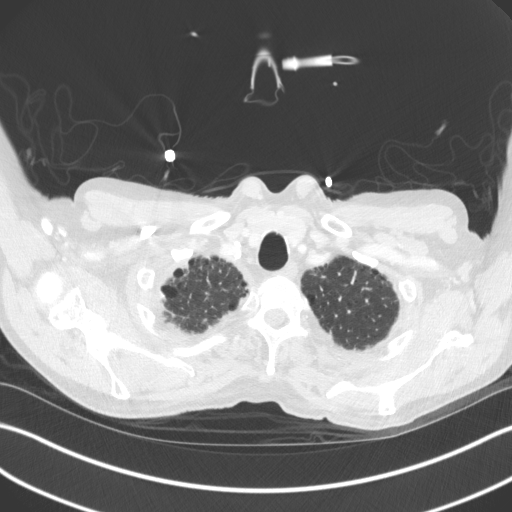
[im 144/156  lung]
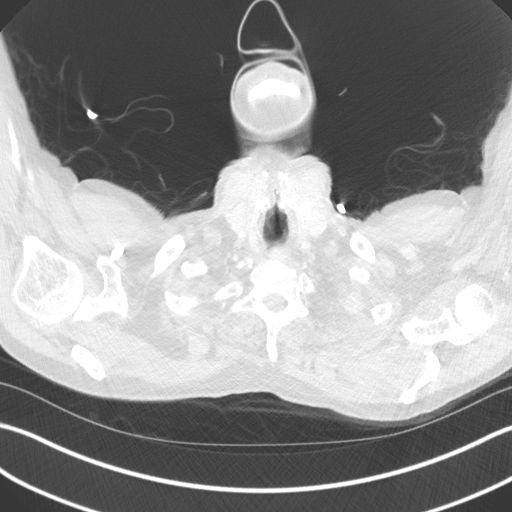

[Series 5: coronal · coronal · 0.64mm/px · 3 of 115 slices shown]
[im 23/115  lung]
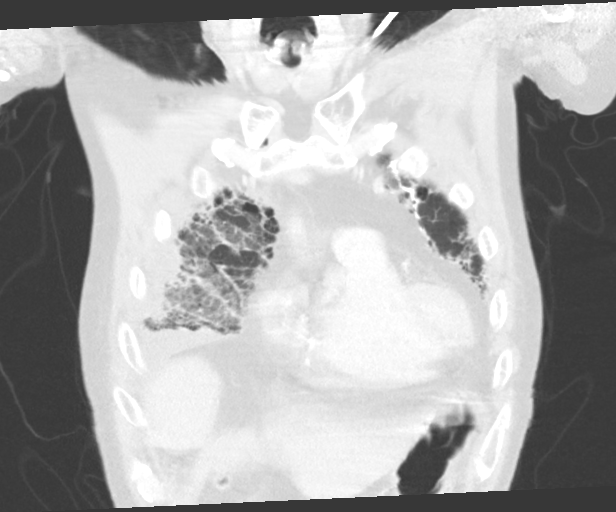
[im 46/115  lung]
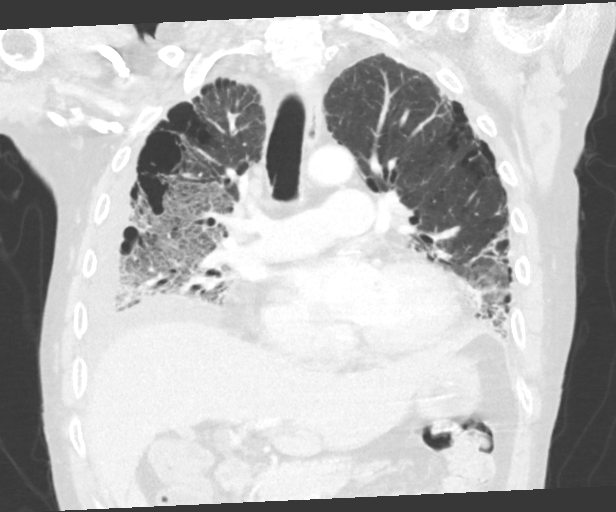
[im 69/115  lung]
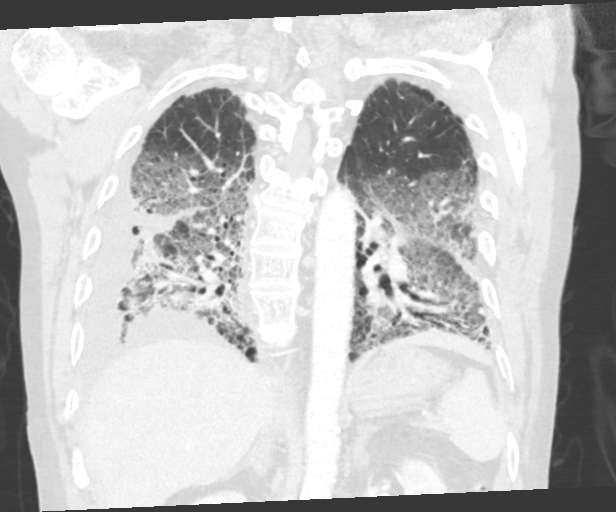

[15 of 36 positions shown; findings below may reference images not displayed]

FINDINGS: Cardiovascular: The right and left pulmonary arteries are enlarged,
measuring 2.5 cm and 2.8 cm respectively, suggestive of pulmonary
hypertension. Vascular calcifications are seen in the coronary
arteries and aortic arch. The heart is mildly enlarged. No
pericardial effusion.

Mediastinum/Nodes: Mildly enlarged mediastinal and bilateral hilar
lymph nodes appear similar to prior exam. The thyroid, trachea, and
esophagus appear normal.

Lungs/Pleura: Paraseptal emphysema is unchanged.

Upper Abdomen: There is moderate to severe bilateral ground-glass
opacities with inter- and intra-lobular septal line thickening.
These findings are increased since 11/06/2019. There is a moderate
right pleural effusion with associated atelectasis. There is no left
pleural effusion. There is no pneumothorax. A 1.0 cm cyst is seen in
the right hepatic lobe, unchanged.

Musculoskeletal: Degenerative changes are seen in the spine.
IMPRESSION: 1. Increased moderate to severe bilateral ground-glass opacities
with inter- and intra-lobular septal line thickening. These findings
likely reflect chronic interstitial lung disease with superimposed
pulmonary edema/pneumonia.
2. Moderate right pleural effusion with associated atelectasis.
3. Mildly enlarged mediastinal and bilateral hilar lymph nodes,
likely reactive.
4. Enlarged right and left pulmonary arteries, suggestive of
pulmonary hypertension.

Aortic Atherosclerosis ([id number]) and Emphysema ([id number]).
